# Patient Record
Sex: Female | Born: 1945 | Race: White | Hispanic: No | Marital: Married | Smoking: Never smoker
Health system: Southern US, Community
[De-identification: ages and names within clinical notes are randomized; demographics above are authoritative.]

## PROBLEM LIST (undated history)

## (undated) DIAGNOSIS — K5790 Diverticulosis of intestine, part unspecified, without perforation or abscess without bleeding: Secondary | ICD-10-CM

## (undated) DIAGNOSIS — E785 Hyperlipidemia, unspecified: Secondary | ICD-10-CM

## (undated) DIAGNOSIS — K589 Irritable bowel syndrome without diarrhea: Secondary | ICD-10-CM

## (undated) DIAGNOSIS — I10 Essential (primary) hypertension: Secondary | ICD-10-CM

---

## 2021-04-22 ENCOUNTER — Inpatient Hospital Stay (HOSPITAL_BASED_OUTPATIENT_CLINIC_OR_DEPARTMENT_OTHER)
Admission: EM | Admit: 2021-04-22 | Discharge: 2021-04-26 | DRG: 356 | Disposition: A | Discharge status: Home / Self Care | Payer: Medicare Other | Attending: Internal Medicine | Admitting: Internal Medicine

## 2021-04-22 ENCOUNTER — Encounter (HOSPITAL_BASED_OUTPATIENT_CLINIC_OR_DEPARTMENT_OTHER): Payer: Self-pay | Admitting: Emergency Medicine

## 2021-04-22 ENCOUNTER — Other Ambulatory Visit: Payer: Self-pay

## 2021-04-22 DIAGNOSIS — K922 Gastrointestinal hemorrhage, unspecified: Secondary | ICD-10-CM | POA: Insufficient documentation

## 2021-04-22 DIAGNOSIS — R55 Syncope and collapse: Secondary | ICD-10-CM | POA: Diagnosis not present

## 2021-04-22 DIAGNOSIS — Z7982 Long term (current) use of aspirin: Secondary | ICD-10-CM

## 2021-04-22 DIAGNOSIS — Z8673 Personal history of transient ischemic attack (TIA), and cerebral infarction without residual deficits: Secondary | ICD-10-CM

## 2021-04-22 DIAGNOSIS — R52 Pain, unspecified: Secondary | ICD-10-CM

## 2021-04-22 DIAGNOSIS — I82451 Acute embolism and thrombosis of right peroneal vein: Secondary | ICD-10-CM | POA: Diagnosis present

## 2021-04-22 DIAGNOSIS — I959 Hypotension, unspecified: Secondary | ICD-10-CM | POA: Diagnosis not present

## 2021-04-22 DIAGNOSIS — R911 Solitary pulmonary nodule: Secondary | ICD-10-CM | POA: Diagnosis present

## 2021-04-22 DIAGNOSIS — D72829 Elevated white blood cell count, unspecified: Secondary | ICD-10-CM | POA: Diagnosis present

## 2021-04-22 DIAGNOSIS — K219 Gastro-esophageal reflux disease without esophagitis: Secondary | ICD-10-CM | POA: Diagnosis present

## 2021-04-22 DIAGNOSIS — K5731 Diverticulosis of large intestine without perforation or abscess with bleeding: Secondary | ICD-10-CM | POA: Diagnosis not present

## 2021-04-22 DIAGNOSIS — R000 Tachycardia, unspecified: Secondary | ICD-10-CM | POA: Diagnosis not present

## 2021-04-22 DIAGNOSIS — Z800 Family history of malignant neoplasm of digestive organs: Secondary | ICD-10-CM

## 2021-04-22 DIAGNOSIS — D62 Acute posthemorrhagic anemia: Secondary | ICD-10-CM | POA: Diagnosis present

## 2021-04-22 DIAGNOSIS — I2699 Other pulmonary embolism without acute cor pulmonale: Secondary | ICD-10-CM

## 2021-04-22 DIAGNOSIS — E785 Hyperlipidemia, unspecified: Secondary | ICD-10-CM | POA: Diagnosis present

## 2021-04-22 DIAGNOSIS — K625 Hemorrhage of anus and rectum: Secondary | ICD-10-CM | POA: Diagnosis not present

## 2021-04-22 DIAGNOSIS — Z20822 Contact with and (suspected) exposure to covid-19: Secondary | ICD-10-CM | POA: Diagnosis present

## 2021-04-22 DIAGNOSIS — Z79899 Other long term (current) drug therapy: Secondary | ICD-10-CM

## 2021-04-22 DIAGNOSIS — F39 Unspecified mood [affective] disorder: Secondary | ICD-10-CM | POA: Diagnosis present

## 2021-04-22 DIAGNOSIS — I10 Essential (primary) hypertension: Secondary | ICD-10-CM | POA: Diagnosis present

## 2021-04-22 DIAGNOSIS — K589 Irritable bowel syndrome without diarrhea: Secondary | ICD-10-CM | POA: Diagnosis present

## 2021-04-22 DIAGNOSIS — Z86010 Personal history of colonic polyps: Secondary | ICD-10-CM

## 2021-04-22 HISTORY — DX: Essential (primary) hypertension: I10

## 2021-04-22 HISTORY — DX: Irritable bowel syndrome without diarrhea: K58.9

## 2021-04-22 HISTORY — DX: Diverticulosis of intestine, part unspecified, without perforation or abscess without bleeding: K57.90

## 2021-04-22 HISTORY — DX: Hyperlipidemia, unspecified: E78.5

## 2021-04-22 LAB — CBC WITH DIFFERENTIAL/PLATELET
Abs Immature Granulocytes: 0.04 10*3/uL (ref 0.00–0.07)
Basophils Absolute: 0 10*3/uL (ref 0.0–0.1)
Basophils Relative: 0 %
Eosinophils Absolute: 0 10*3/uL (ref 0.0–0.5)
Eosinophils Relative: 0 %
HCT: 41.2 % (ref 36.0–46.0)
Hemoglobin: 13.3 g/dL (ref 12.0–15.0)
Immature Granulocytes: 0 %
Lymphocytes Relative: 13 %
Lymphs Abs: 1.6 10*3/uL (ref 0.7–4.0)
MCH: 29.5 pg (ref 26.0–34.0)
MCHC: 32.3 g/dL (ref 30.0–36.0)
MCV: 91.4 fL (ref 80.0–100.0)
Monocytes Absolute: 0.4 10*3/uL (ref 0.1–1.0)
Monocytes Relative: 4 %
Neutro Abs: 10 10*3/uL — ABNORMAL HIGH (ref 1.7–7.7)
Neutrophils Relative %: 83 %
Platelets: 241 10*3/uL (ref 150–400)
RBC: 4.51 MIL/uL (ref 3.87–5.11)
RDW: 13.2 % (ref 11.5–15.5)
WBC: 12.1 10*3/uL — ABNORMAL HIGH (ref 4.0–10.5)
nRBC: 0 % (ref 0.0–0.2)

## 2021-04-22 LAB — COMPREHENSIVE METABOLIC PANEL
ALT: 14 U/L (ref 0–44)
AST: 15 U/L (ref 15–41)
Albumin: 4.2 g/dL (ref 3.5–5.0)
Alkaline Phosphatase: 74 U/L (ref 38–126)
Anion gap: 8 (ref 5–15)
BUN: 19 mg/dL (ref 8–23)
CO2: 28 mmol/L (ref 22–32)
Calcium: 9.7 mg/dL (ref 8.9–10.3)
Chloride: 102 mmol/L (ref 98–111)
Creatinine, Ser: 0.5 mg/dL (ref 0.44–1.00)
GFR, Estimated: >60 mL/min (ref >60)
Glucose, Bld: 132 mg/dL — ABNORMAL HIGH (ref 70–99)
Potassium: 3.8 mmol/L (ref 3.5–5.1)
Sodium: 138 mmol/L (ref 135–145)
Total Bilirubin: 0.5 mg/dL (ref 0.3–1.2)
Total Protein: 6.7 g/dL (ref 6.5–8.1)

## 2021-04-22 LAB — LIPASE, BLOOD: Lipase: 18 U/L (ref 11–51)

## 2021-04-22 MED ORDER — SODIUM CHLORIDE 0.9 % IV SOLN: Freq: Once | INTRAVENOUS | Status: AC

## 2021-04-22 NOTE — ED Provider Notes (Signed)
[facility] Provider Note: Lowella Dell, MD, FACEP  CSN: [id number] MRN: [id number] ARRIVAL: 04/22/21 at 2009 ROOM: DB012/DB012   CHIEF COMPLAINT  Rectal Bleeding   HISTORY OF PRESENT ILLNESS  04/22/21 11:06 PM Stephanie Bowers is a 75 y.o. female who has had bright red blood per rectum since about 6 PM.  She has had about 6 grossly bloody bowel movements but also has persistent leakage of blood from her rectum.  She has no associated pain.  She denies lightheadedness, chest pain or shortness of breath.  She did vomit once but attributes this to nerves.  She is not on anticoagulation apart from 81 mg of aspirin daily.   Past Medical History:  Diagnosis Date   Hyperlipidemia    Hypertension    IBS (irritable bowel syndrome)     History reviewed. No pertinent surgical history.  History reviewed. No pertinent family history.  Social History   Tobacco Use   Smoking status: Never  Substance Use Topics   Alcohol use: Yes    Prior to Admission medications   Not on File    Allergies Patient has no allergy information on record.   REVIEW OF SYSTEMS  Negative except as noted here or in the History of Present Illness.   PHYSICAL EXAMINATION  Initial Vital Signs Blood pressure 132/75, pulse 75, temperature 98.6 F (37 C), resp. rate 18, height 5\' 6"  (1.676 m), weight 76.2 kg, SpO2 95 %.  Examination General: Well-developed, well-nourished female in no acute distress; appearance consistent with age of record HENT: normocephalic; atraumatic Eyes: pupils equal, round and reactive to light; extraocular muscles intact Neck: supple Heart: regular rate and rhythm Lungs: clear to auscultation bilaterally Abdomen: soft; nondistended; nontender; bowel sounds present Rectal: Normal sphincter tone; profuse bright red blood and clots in perianal area; blood and clots in rectal vault Extremities: No deformity; full range of motion; pulses normal Neurologic: Awake,  alert and oriented; motor function intact in all extremities and symmetric; no facial droop Skin: Warm and dry Psychiatric: Normal mood and affect   RESULTS  Summary of this visit's results, reviewed and interpreted by myself:   EKG Interpretation  Date/Time:    Ventricular Rate:    PR Interval:    QRS Duration:   QT Interval:    QTC Calculation:   R Axis:     Text Interpretation:         Laboratory Studies: Results for orders placed or performed during the hospital encounter of 04/22/21 (from the past 24 hour(s))  CBC with Differential     Status: Abnormal   Collection Time: 04/22/21  9:17 PM  Result Value Ref Range   WBC 12.1 (H) 4.0 - 10.5 K/uL   RBC 4.51 3.87 - 5.11 MIL/uL   Hemoglobin 13.3 12.0 - 15.0 g/dL   HCT 04/24/21 51.7 - 61.6 %   MCV 91.4 80.0 - 100.0 fL   MCH 29.5 26.0 - 34.0 pg   MCHC 32.3 30.0 - 36.0 g/dL   RDW 07.3 71.0 - 62.6 %   Platelets 241 150 - 400 K/uL   nRBC 0.0 0.0 - 0.2 %   Neutrophils Relative % 83 %   Neutro Abs 10.0 (H) 1.7 - 7.7 K/uL   Lymphocytes Relative 13 %   Lymphs Abs 1.6 0.7 - 4.0 K/uL   Monocytes Relative 4 %   Monocytes Absolute 0.4 0.1 - 1.0 K/uL   Eosinophils Relative 0 %   Eosinophils Absolute 0.0 0.0 - 0.5 K/uL  Basophils Relative 0 %   Basophils Absolute 0.0 0.0 - 0.1 K/uL   Immature Granulocytes 0 %   Abs Immature Granulocytes 0.04 0.00 - 0.07 K/uL  Comprehensive metabolic panel     Status: Abnormal   Collection Time: 04/22/21  9:17 PM  Result Value Ref Range   Sodium 138 135 - 145 mmol/L   Potassium 3.8 3.5 - 5.1 mmol/L   Chloride 102 98 - 111 mmol/L   CO2 28 22 - 32 mmol/L   Glucose, Bld 132 (H) 70 - 99 mg/dL   BUN 19 8 - 23 mg/dL   Creatinine, Ser 0.96 0.44 - 1.00 mg/dL   Calcium 9.7 8.9 - 04.5 mg/dL   Total Protein 6.7 6.5 - 8.1 g/dL   Albumin 4.2 3.5 - 5.0 g/dL   AST 15 15 - 41 U/L   ALT 14 0 - 44 U/L   Alkaline Phosphatase 74 38 - 126 U/L   Total Bilirubin 0.5 0.3 - 1.2 mg/dL   GFR, Estimated >40 >98  mL/min   Anion gap 8 5 - 15  Lipase, blood     Status: None   Collection Time: 04/22/21  9:17 PM  Result Value Ref Range   Lipase 18 11 - 51 U/L   Imaging Studies: No results found.  ED COURSE and MDM  Nursing notes, initial and subsequent vitals signs, including pulse oximetry, reviewed and interpreted by myself.  Vitals:   04/22/21 2022 04/22/21 2200  BP: (!) 144/92 132/75  Pulse: 96 75  Resp: 18 18  Temp: 98.6 F (37 C)   SpO2: 97% 95%  Weight: 76.2 kg   Height: 5\' 6"  (1.676 m)    Medications  0.9 %  sodium chloride infusion (has no administration in time range)   Patient has gross rectal bleeding with passage of clots.  We will have the patient admitted for further treatment and work-up.  Her gastroenterologist is in [place] and has no local physician.  Dr. [place] to admit patient to hospitalist service.  Dr. Arville Care of gastroenterology consulted.  PROCEDURES  Procedures   ED DIAGNOSES     ICD-10-CM   1. Acute lower GI bleeding  K92.2          Loretta Doutt, MD 04/23/21 0157

## 2021-04-22 NOTE — ED Triage Notes (Signed)
Pt arrives pov with c/o acute onset rectal bleeding tonight, endorses 6 episodes. Pt denies constipation or diarrhea. Pt also reports 1 episode of emesis. Denies thinners, endorses 81 mg aspirin daily

## 2021-04-23 ENCOUNTER — Inpatient Hospital Stay (HOSPITAL_COMMUNITY): Payer: Medicare Other

## 2021-04-23 ENCOUNTER — Encounter (HOSPITAL_BASED_OUTPATIENT_CLINIC_OR_DEPARTMENT_OTHER): Payer: Self-pay | Admitting: Family Medicine

## 2021-04-23 DIAGNOSIS — I2699 Other pulmonary embolism without acute cor pulmonale: Secondary | ICD-10-CM | POA: Diagnosis present

## 2021-04-23 DIAGNOSIS — E785 Hyperlipidemia, unspecified: Secondary | ICD-10-CM

## 2021-04-23 DIAGNOSIS — K5731 Diverticulosis of large intestine without perforation or abscess with bleeding: Secondary | ICD-10-CM | POA: Diagnosis present

## 2021-04-23 DIAGNOSIS — K922 Gastrointestinal hemorrhage, unspecified: Secondary | ICD-10-CM

## 2021-04-23 DIAGNOSIS — Z86010 Personal history of colonic polyps: Secondary | ICD-10-CM | POA: Diagnosis not present

## 2021-04-23 DIAGNOSIS — R000 Tachycardia, unspecified: Secondary | ICD-10-CM | POA: Diagnosis not present

## 2021-04-23 DIAGNOSIS — I959 Hypotension, unspecified: Secondary | ICD-10-CM | POA: Diagnosis not present

## 2021-04-23 DIAGNOSIS — I10 Essential (primary) hypertension: Secondary | ICD-10-CM | POA: Diagnosis present

## 2021-04-23 DIAGNOSIS — R911 Solitary pulmonary nodule: Secondary | ICD-10-CM | POA: Diagnosis present

## 2021-04-23 DIAGNOSIS — Z800 Family history of malignant neoplasm of digestive organs: Secondary | ICD-10-CM | POA: Diagnosis not present

## 2021-04-23 DIAGNOSIS — D62 Acute posthemorrhagic anemia: Secondary | ICD-10-CM | POA: Diagnosis present

## 2021-04-23 DIAGNOSIS — Z7982 Long term (current) use of aspirin: Secondary | ICD-10-CM | POA: Diagnosis not present

## 2021-04-23 DIAGNOSIS — K589 Irritable bowel syndrome without diarrhea: Secondary | ICD-10-CM | POA: Diagnosis present

## 2021-04-23 DIAGNOSIS — R55 Syncope and collapse: Secondary | ICD-10-CM | POA: Diagnosis not present

## 2021-04-23 DIAGNOSIS — D72829 Elevated white blood cell count, unspecified: Secondary | ICD-10-CM

## 2021-04-23 DIAGNOSIS — K219 Gastro-esophageal reflux disease without esophagitis: Secondary | ICD-10-CM

## 2021-04-23 DIAGNOSIS — I82451 Acute embolism and thrombosis of right peroneal vein: Secondary | ICD-10-CM | POA: Diagnosis present

## 2021-04-23 DIAGNOSIS — F39 Unspecified mood [affective] disorder: Secondary | ICD-10-CM | POA: Diagnosis present

## 2021-04-23 DIAGNOSIS — F419 Anxiety disorder, unspecified: Secondary | ICD-10-CM | POA: Diagnosis not present

## 2021-04-23 DIAGNOSIS — Z20822 Contact with and (suspected) exposure to covid-19: Secondary | ICD-10-CM | POA: Diagnosis present

## 2021-04-23 DIAGNOSIS — K625 Hemorrhage of anus and rectum: Secondary | ICD-10-CM | POA: Diagnosis present

## 2021-04-23 DIAGNOSIS — Z79899 Other long term (current) drug therapy: Secondary | ICD-10-CM | POA: Diagnosis not present

## 2021-04-23 DIAGNOSIS — Z8673 Personal history of transient ischemic attack (TIA), and cerebral infarction without residual deficits: Secondary | ICD-10-CM | POA: Diagnosis not present

## 2021-04-23 HISTORY — PX: IR ANGIOGRAM SELECTIVE EACH ADDITIONAL VESSEL: IMG667

## 2021-04-23 HISTORY — PX: IR EMBO ARTERIAL NOT HEMORR HEMANG INC GUIDE ROADMAPPING: IMG5448

## 2021-04-23 HISTORY — PX: IR US GUIDE VASC ACCESS RIGHT: IMG2390

## 2021-04-23 HISTORY — PX: IR ANGIOGRAM VISCERAL SELECTIVE: IMG657

## 2021-04-23 LAB — MAGNESIUM: Magnesium: 2.1 mg/dL (ref 1.7–2.4)

## 2021-04-23 LAB — CBC
HCT: 36.9 % (ref 36.0–46.0)
HCT: 35.8 % — ABNORMAL LOW (ref 36.0–46.0)
Hemoglobin: 12.2 g/dL (ref 12.0–15.0)
Hemoglobin: 11.7 g/dL — ABNORMAL LOW (ref 12.0–15.0)
MCH: 30.2 pg (ref 26.0–34.0)
MCH: 30.2 pg (ref 26.0–34.0)
MCHC: 33.1 g/dL (ref 30.0–36.0)
MCHC: 32.7 g/dL (ref 30.0–36.0)
MCV: 91.3 fL (ref 80.0–100.0)
MCV: 92.5 fL (ref 80.0–100.0)
Platelets: 216 10*3/uL (ref 150–400)
Platelets: 219 10*3/uL (ref 150–400)
RBC: 4.04 MIL/uL (ref 3.87–5.11)
RBC: 3.87 MIL/uL (ref 3.87–5.11)
RDW: 13.1 % (ref 11.5–15.5)
RDW: 13.2 % (ref 11.5–15.5)
WBC: 10.1 10*3/uL (ref 4.0–10.5)
WBC: 11.3 10*3/uL — ABNORMAL HIGH (ref 4.0–10.5)
nRBC: 0 % (ref 0.0–0.2)
nRBC: 0 % (ref 0.0–0.2)

## 2021-04-23 LAB — COMPREHENSIVE METABOLIC PANEL
ALT: 17 U/L (ref 0–44)
AST: 17 U/L (ref 15–41)
Albumin: 3.2 g/dL — ABNORMAL LOW (ref 3.5–5.0)
Alkaline Phosphatase: 68 U/L (ref 38–126)
Anion gap: 10 (ref 5–15)
BUN: 16 mg/dL (ref 8–23)
CO2: 25 mmol/L (ref 22–32)
Calcium: 8.2 mg/dL — ABNORMAL LOW (ref 8.9–10.3)
Chloride: 102 mmol/L (ref 98–111)
Creatinine, Ser: 0.52 mg/dL (ref 0.44–1.00)
GFR, Estimated: >60 mL/min (ref >60)
Glucose, Bld: 127 mg/dL — ABNORMAL HIGH (ref 70–99)
Potassium: 3.4 mmol/L — ABNORMAL LOW (ref 3.5–5.1)
Sodium: 137 mmol/L (ref 135–145)
Total Bilirubin: 0.8 mg/dL (ref 0.3–1.2)
Total Protein: 5.7 g/dL — ABNORMAL LOW (ref 6.5–8.1)

## 2021-04-23 LAB — TYPE AND SCREEN
ABO/RH(D): A POS
Antibody Screen: NEGATIVE

## 2021-04-23 LAB — RESP PANEL BY RT-PCR (FLU A&B, COVID) ARPGX2
Influenza A by PCR: NEGATIVE
Influenza B by PCR: NEGATIVE
SARS Coronavirus 2 by RT PCR: NEGATIVE

## 2021-04-23 LAB — HEMOGLOBIN AND HEMATOCRIT, BLOOD
HCT: 36.7 % (ref 36.0–46.0)
Hemoglobin: 12.1 g/dL (ref 12.0–15.0)

## 2021-04-23 LAB — PROTIME-INR
INR: 1 (ref 0.8–1.2)
Prothrombin Time: 13.6 seconds (ref 11.4–15.2)

## 2021-04-23 LAB — ABO/RH: ABO/RH(D): A POS

## 2021-04-23 MED ORDER — METOCLOPRAMIDE HCL 5 MG/ML IJ SOLN
10.0000 mg | Freq: Once | INTRAMUSCULAR | Status: AC
Start: 2021-04-23 — End: 2021-04-23
  Administered 2021-04-23: 10 mg via INTRAVENOUS
  Filled 2021-04-23: qty 2

## 2021-04-23 MED ORDER — MIDAZOLAM HCL 2 MG/2ML IJ SOLN
INTRAMUSCULAR | Status: AC
  Filled 2021-04-23: qty 2

## 2021-04-23 MED ORDER — LIP MEDEX EX OINT
TOPICAL_OINTMENT | CUTANEOUS | Status: DC | PRN
  Filled 2021-04-23: qty 7

## 2021-04-23 MED ORDER — BISACODYL 5 MG PO TBEC
15.0000 mg | DELAYED_RELEASE_TABLET | Freq: Once | ORAL | Status: AC
  Administered 2021-04-23: 15 mg via ORAL
  Filled 2021-04-23: qty 3

## 2021-04-23 MED ORDER — MIDAZOLAM HCL 2 MG/2ML IJ SOLN
INTRAMUSCULAR | Status: AC | PRN
  Administered 2021-04-23 (×2): 1 mg via INTRAVENOUS

## 2021-04-23 MED ORDER — METOCLOPRAMIDE HCL 5 MG/ML IJ SOLN
10.0000 mg | Freq: Once | INTRAMUSCULAR | Status: DC
  Filled 2021-04-23: qty 2

## 2021-04-23 MED ORDER — FENTANYL CITRATE (PF) 100 MCG/2ML IJ SOLN
INTRAMUSCULAR | Status: AC
  Filled 2021-04-23: qty 2

## 2021-04-23 MED ORDER — LACTATED RINGERS IV SOLN: INTRAVENOUS | Status: DC

## 2021-04-23 MED ORDER — PANTOPRAZOLE SODIUM 40 MG IV SOLR
40.0000 mg | INTRAVENOUS | Status: DC
  Administered 2021-04-23 – 2021-04-25 (×3): 40 mg via INTRAVENOUS
  Filled 2021-04-23 (×3): qty 40

## 2021-04-23 MED ORDER — PEG 3350-KCL-NABCB-NACL-NASULF 236 G PO SOLR
4000.0000 mL | Freq: Once | ORAL | Status: DC
  Filled 2021-04-23: qty 4000

## 2021-04-23 MED ORDER — ACETAMINOPHEN 650 MG RE SUPP: 650.0000 mg | Freq: Four times a day (QID) | RECTAL | Status: DC | PRN

## 2021-04-23 MED ORDER — GELATIN ABSORBABLE 12-7 MM EX MISC
CUTANEOUS | Status: AC
  Filled 2021-04-23: qty 1

## 2021-04-23 MED ORDER — IOHEXOL 350 MG/ML SOLN
100.0000 mL | Freq: Once | INTRAVENOUS | Status: AC | PRN
  Administered 2021-04-23: 100 mL via INTRAVENOUS

## 2021-04-23 MED ORDER — POTASSIUM CHLORIDE 2 MEQ/ML IV SOLN
INTRAVENOUS | Status: DC
  Filled 2021-04-23 (×3): qty 1000

## 2021-04-23 MED ORDER — FENTANYL CITRATE (PF) 100 MCG/2ML IJ SOLN
INTRAMUSCULAR | Status: AC | PRN
  Administered 2021-04-23 (×2): 25 ug via INTRAVENOUS
  Administered 2021-04-23: 50 ug via INTRAVENOUS

## 2021-04-23 MED ORDER — IOHEXOL 300 MG/ML  SOLN
100.0000 mL | Freq: Once | INTRAMUSCULAR | Status: AC | PRN
  Administered 2021-04-23: 62 mL via INTRA_ARTERIAL

## 2021-04-23 MED ORDER — LIDOCAINE HCL 1 % IJ SOLN
INTRAMUSCULAR | Status: AC
  Administered 2021-04-23: 10 mL
  Filled 2021-04-23: qty 20

## 2021-04-23 MED ORDER — PEG-KCL-NACL-NASULF-NA ASC-C 100 G PO SOLR: 1.0000 | Freq: Once | ORAL | Status: DC

## 2021-04-23 MED ORDER — PEG-KCL-NACL-NASULF-NA ASC-C 100 G PO SOLR
0.5000 | Freq: Once | ORAL | Status: DC
  Filled 2021-04-23: qty 1

## 2021-04-23 MED ORDER — ONDANSETRON HCL 4 MG/2ML IJ SOLN
4.0000 mg | Freq: Four times a day (QID) | INTRAMUSCULAR | Status: DC | PRN
  Administered 2021-04-23: 4 mg via INTRAVENOUS
  Filled 2021-04-23: qty 2

## 2021-04-23 MED ORDER — PEG 3350-KCL-NABCB-NACL-NASULF 236 G PO SOLR: 4000.0000 mL | Freq: Once | ORAL | Status: DC

## 2021-04-23 MED ORDER — ACETAMINOPHEN 325 MG PO TABS
650.0000 mg | ORAL_TABLET | Freq: Four times a day (QID) | ORAL | Status: DC | PRN
  Administered 2021-04-24 – 2021-04-26 (×5): 650 mg via ORAL
  Filled 2021-04-23 (×6): qty 2

## 2021-04-23 MED ORDER — PEG-KCL-NACL-NASULF-NA ASC-C 100 G PO SOLR: 0.5000 | Freq: Once | ORAL | Status: DC

## 2021-04-23 NOTE — Sedation Documentation (Signed)
Report called to [place], Charity fundraiser. Pt tolerated procedure very well. Vitals stable.  Totals: Time: Fentanyl Versed 2mg 

## 2021-04-23 NOTE — Progress Notes (Signed)
  PROGRESS NOTE  Patient admitted earlier this morning. See H&P.   Patient presented with multiple episodes of bright red blood per rectum.  She is currently on baby aspirin daily, has recently been taking ibuprofen due to her shoulder pain about a week and a half ago.  Denies any melena, nausea, vomiting or abdominal pain.  GI consulted Hold aspirin Continue to monitor hemoglobin Remains on clear liquid diet Replace potassium  Status is: Inpatient  Remains inpatient appropriate because: GI bleed work-up   Noralee Stain, DO [facility] 04/23/2021, 12:11 PM  Available via Epic secure chat 7am-7pm After these hours, please refer to coverage provider listed on [email or web address]

## 2021-04-23 NOTE — Sedation Documentation (Signed)
Patient is resting comfortably. 

## 2021-04-23 NOTE — Sedation Documentation (Signed)
Vital signs stable. 

## 2021-04-23 NOTE — Sedation Documentation (Signed)
Patient is resting comfortably. Asleep 

## 2021-04-23 NOTE — H&P (Signed)
History and Physical    PLEASE NOTE THAT DRAGON DICTATION SOFTWARE WAS USED IN THE CONSTRUCTION OF THIS NOTE.   Stephanie Bowers VBT:[id number] DOB: [birth year] DOA: 04/22/2021  PCP: Norton Pastel, MD  Patient coming from: home   I have personally briefly reviewed patient's old medical records in [place]  Chief Complaint:  bright red Blood per rectum  HPI: Stephanie Bowers is a 75 y.o. female with medical history significant for diverticulosis, hypertension, hyperlipidemia, GERD, who is admitted to [facility] on 04/22/2021 by way of transfer from [facility] emergency department with suspected acute lower gastrointestinal bleed after presenting from home to the latter facility complaining of bright red blood per rectum.   Starting between 1718 100 on 04/23/2019, reports development of 5-6 episodes of bright red blood per rectum with most recent episode occurring at [facility] emergency department.  Denies any associated melena, abdominal pain, or recent preceding trauma.  She reported transient nausea resulting in 1 episode of nonbloody, nonbilious emesis, without any subsequent nausea noted.  No recent diarrhea.  Denies any personal history of malignancy or unintentional recent weight loss.denies any associated chest pain, shortness of breath, palpitations, dizziness, or diaphoresis. Denies any associated subjective fever, chills, or rigors.  Patient does not recall at this time the timing of her most recent prior colonoscopy.  She notes daily consumption of 1 glass of wine but no recent increase in volume of alcohol consumed relative to this baseline.  On a daily baby aspirin, but otherwise no additional blood thinners at home.  She acknowledges a history of GERD for which she is on Dexilant.  She conveys that she typically abstains from NSAIDs, but notes approximately 1 week ago, that she started taking 400 ibuprofen as well as 1 dose of naproxen per day x3 days for  musculoskeletal discomfort involving the left shoulder.  Last dose of NSAIDs occurring 3 to 4 days ago.  Denies any use of BC's or Goodies powder.  She denies any known prior history of gastrointestinal bleed.  And denies any known history of chronic liver disease.    ED Course:  Vital signs in the ED were notable for the following: Afebrile; heart rate 75 cubic 84; blood pressure 120/60 -144/92; respiratory rate 18, oxygen saturation 95 to 97% on room air.  Labs were notable for the following: CMP is notable for the following BUN 19 compared to most recent prior BUN value of 16 on 09/03/2020, creatinine 0.50, and liver enzymes were found to be within normal limits.  CBC notable for white blood cell count 12,100, hemoglobin 13.3 relative to most recent prior value of 13.9 on 09/03/2020, with presenting hemoglobin associated with normocytic/normochromic findings as well as nonelevated RDW, platelet count 241.  Screening COVID-19/influenza PCR were found to be negative.  EDP at [place] discussed patient's case with on-call gastroenterologist, Dr. Paulita Fujita, who recommended admission to the hospitalist service at [facility] for further evaluation and management of suspected acute lower gastrointestinal bleed, with Dr. Paulita Fujita conveying that he will formally consult and plans to see the patient this morning.   While in the ED, the following were administered: Continuous normal saline.     Review of Systems: As per HPI otherwise 10 point review of systems negative.   Past Medical History:  Diagnosis Date   Diverticulosis    Hyperlipidemia    Hypertension    IBS (irritable bowel syndrome)     History reviewed. No pertinent surgical history.  Social History:  reports  that she has never smoked. She has never used smokeless tobacco. She reports current alcohol use. She reports that she does not use drugs.  History reviewed. No pertinent family history.   Outpatient medications: Outpatient  medications include as needed Xanax, daily baby aspirin, Dexilant, Lexapro, HCTZ, losartan, and rosuvastatin 20 mg p.o. daily.    Objective    Physical Exam: Vitals:   04/22/21 2300 04/23/21 0200 04/23/21 0300 04/23/21 0405  BP: 119/74 123/88 122/80 120/66  Pulse: 85 82 80 84  Resp: $Remo'18 18  18  'dBNLO$ Temp:    98.4 F (36.9 C)  TempSrc:    Oral  SpO2: 95% 95% 97% 97%  Weight:      Height:        General: appears to be stated age; alert, oriented Skin: warm, dry, no rash Head:  AT/[place] Mouth:  Oral mucosa membranes appear dry, normal dentition Neck: supple; trachea midline Heart:  RRR; did not appreciate any M/R/G Lungs: CTAB, did not appreciate any wheezes, rales, or rhonchi Abdomen: + BS; soft, ND, NT Vascular: 2+ pedal pulses b/l; 2+ radial pulses b/l Extremities: no peripheral edema, no muscle wasting Neuro: strength and sensation intact in upper and lower extremities b/l    Labs on Admission: I have personally reviewed following labs and imaging studies  CBC: Recent Labs  Lab 04/22/21 2117  WBC 12.1*  NEUTROABS 10.0*  HGB 13.3  HCT 41.2  MCV 91.4  PLT 510   Basic Metabolic Panel: Recent Labs  Lab 04/22/21 2117  NA 138  K 3.8  CL 102  CO2 28  GLUCOSE 132*  BUN 19  CREATININE 0.50  CALCIUM 9.7   GFR: Estimated Creatinine Clearance: 62.4 mL/min (by C-G formula based on SCr of 0.5 mg/dL). Liver Function Tests: Recent Labs  Lab 04/22/21 2117  AST 15  ALT 14  ALKPHOS 74  BILITOT 0.5  PROT 6.7  ALBUMIN 4.2   Recent Labs  Lab 04/22/21 2117  LIPASE 18   No results for input(s): AMMONIA in the last 168 hours. Coagulation Profile: No results for input(s): INR, PROTIME in the last 168 hours. Cardiac Enzymes: No results for input(s): CKTOTAL, CKMB, CKMBINDEX, TROPONINI in the last 168 hours. BNP (last 3 results) No results for input(s): PROBNP in the last 8760 hours. HbA1C: No results for input(s): HGBA1C in the last 72 hours. CBG: No results for  input(s): GLUCAP in the last 168 hours. Lipid Profile: No results for input(s): CHOL, HDL, LDLCALC, TRIG, CHOLHDL, LDLDIRECT in the last 72 hours. Thyroid Function Tests: No results for input(s): TSH, T4TOTAL, FREET4, T3FREE, THYROIDAB in the last 72 hours. Anemia Panel: No results for input(s): VITAMINB12, FOLATE, FERRITIN, TIBC, IRON, RETICCTPCT in the last 72 hours. Urine analysis: No results found for: COLORURINE, APPEARANCEUR, LABSPEC, PHURINE, GLUCOSEU, HGBUR, BILIRUBINUR, KETONESUR, PROTEINUR, UROBILINOGEN, NITRITE, LEUKOCYTESUR  Radiological Exams on Admission: No results found.    Assessment/Plan   Principal Problem:   Acute lower GI bleeding Active Problems:   Leukocytosis   Hyperlipidemia   Hypertension   GERD (gastroesophageal reflux disease)     #) Acute lower GI bleed: diagnosis on the basis of 6 episodes of bright red blood per rectum starting between 1718 and 25 2022. A non-elevated presenting BUN further substantiates suspicion of a lower GI source.  Patient's relative the asymptomatic nature as well as her.  Hemodynamic stability also to speak towards a lower gastrointestinal source of her bleed.  Presenting hemoglobin consistent with most recent prior, as further quantified  above.  On daily baby aspirin, but otherwise no additional blood thinners as an outpatient.No reported history of alcohol abuse. No known history of liver disease to warrant SBP prophylaxis.  Recently engaged in 3-day course of naproxen/ibuprofen use, terminating this usage 3 to 4 days ago.  Otherwise, no use of NSAIDs.  Differential appears to favor diverticular bleed in the context of a documented history of diverticulosis.  Differential also includes bleeding colonic polyps, AVM ,hemorrhoids.  Given suspected lower GI source, there does not appear to be an indication for initiation of Protonix drip.  I will order type and screen.  At this time, no indication for empiric PRBC transfusion, but will  closely monitor for ensuing development of septic emesis.  Dr. Paulita Fujita gastroenterology has been consulted, and will formally see the patient in the hospital this morning.      Plan: NPO. Refraining from pharmacologic DVT prophylaxis. Monitor on telemetry. Monitor continuous pulse-ox. Maintain at least 2 large bore IV's. Check INR in the AM.  StatCBC repeat BMP.. Q4H H&H's have been ordered through 9 AM on 04/23/2021. Will closely monitor these ensuing Hgb levels and correlate these data points with the patient's overall clinical picture including vital signs to determine need for subsequent transfusion.  Type and screen ordered by me.  Hold home aspirin.  Gastroenterology consulted, as above.  Gentle continuous LR at 50 cc/h.      #) Leukocytosis: CBC reflects blood cell count of 12,100.  Suspect that this is inflammatory in nature in the setting of presenting acute lower gastrointestinal bleed.  No clinical evidence of underlying infectious process at this time, and criteria for sepsis are not met.  Plan: Further evaluation of suspected presenting acute lower gastrointestinal bleed, as above.  Gentle IV fluids, as above.  Repeat CBC with differential in the morning.  Check urinalysis.      #) Essential Hypertension: documented history of such, with outpatient antihypertensive regimen including losartan, HCTZ.  Systolic blood pressures pressure in the ED today in the 120s to 140s, without any evidence of hypotension.    Plan: Close monitoring of subsequent blood pressure via routine vital signs.  In the setting of current n.p.o. status prompted by acute lower gastrointestinal bleed, will hold home antihypertensive medications for now.       #) Hyperlipidemia: documented h/o such. On high intensity rosuvastatin as outpatient.    Plan: Hold home statin for now in the setting of current n.p.o. status.      #) GERD: Documented history of such, on Dexilant as an outpatient followed  by gastroenterology associated with [facility] in the [place] area.  Plan: In the setting of Current n.p.o. status, will hold home [place] for now.  Rather I have ordered Protonix 40 mg IV daily while remaining n.p.o.     DVT prophylaxis: SCD's   Code Status: Full code Family Communication: I discussed the patient's case with her husband, who is present at bedside Disposition Plan: Per Rounding Team Consults called: EDP at [facility] consulted Dr. Paulita Fujita of GI, as [place] detailed above;  Admission status: Accepting physician placed order for inpatient admission; med telemetry   PLEASE NOTE THAT DRAGON DICTATION SOFTWARE WAS USED IN THE CONSTRUCTION OF THIS NOTE.   [place] DO [facility]  From [place]   04/23/2021, 4:24 AM

## 2021-04-23 NOTE — Sedation Documentation (Signed)
Patient is resting comfortably. Vitals stable. Procedure continues

## 2021-04-23 NOTE — Sedation Documentation (Signed)
Report given to [facility] via phone and at bedside. Right groin unremarkable at handoff. Pt vitals stable. Pt has no complaints at this time. Pt husband at bedside.

## 2021-04-23 NOTE — Progress Notes (Addendum)
     CTA shows active bleeding in the distal sigmoid. I discussed the results with the patient and her husband by phone. They would like to proceed with IR consultation for angiogram +/- embolization.   Tressia Danas, MD, MPH [facility]

## 2021-04-23 NOTE — Procedures (Signed)
Pre-procedure Diagnosis: Acute lower GI bleeding Post-procedure Diagnosis: Same  Post mesenteric arteriogram and percutaneous coil embolization of distal arcade of sigmoidal artery supplying ill defined area of diverticular bleeding.    Complications: None Immediate EBL: None  Keep right leg straight for 4 hrs (until 02:30)  Signed: Simonne Come Pager: [phone] 04/23/2021, 10:40 PM

## 2021-04-23 NOTE — Consult Note (Signed)
Referring Provider: [facility] PCP: Norton Pastel, MD  Gastroenterologist: [place] Reason for consultation:    GI bleed               ASSESSMENT / PLAN   # 75 yo female from [place] admitted with painless hematochezia. She gives a history of diverticulosis. Suspect this is a diverticular hemorrhage. Low suspicion for brisk upper bleed despite recent NSAID use and light brown emesis yesterday (previously had some bourbon). She is hemodynamically stable     Hgb today is 12.1, down from 13.3, yesterday.   -I have discussed plan with patient and her RN. If persist bleeding then RN will contact us about getting CTA abd/pelvis.   Cr 0.52.         --Repeat CBC at noon --Clear liquids okay --Agree with BID PPI for now for precautionary measures   # GERD, symptoms controlled on Dexilant. No history of Barrett's esophagus per patient  # Reported personal history of colon polyps / [facility] of colon cancer in father. Followed by GI in [place]. Gets 5 year surveillance colonoscopies, next one due within a couple of months.   # Additional medical history listed below.   HISTORY OF PRESENT ILLNESS                                                                                                                         Chief Complaint: rectal bleeding  Stephanie Bowers is a 75 y.o. female with a past medical history significant for GAD, diverticulosis, IBS, HTN, HLD, GERD,  See [facility] for any additional medical problems.    ED course:  Patient presented to ED yesterday for evaluation of rectal bleeding and one episode of emesis.   In ED, VSS. WBC12.1, hgb 13.3, MCV 91, platelets 241. BUN 19, lipase normal. Liver tests normal.   Patient is followed by GI in [place] for history of GERD, IBS, history of colon polyps and a [facility] of colon cancer in her father ( diagnosed age 89). She is due for 5 year follow up colonoscopy within the next couples of months.  No previous history of GI bleeding.  Yesterday patient began having painless rectal bleeding. She was passing " gushes" of red blood without stool. A couple of hours later she vomited once ( dark emesis but had also had some bourbon). She thinks the vomiting was due to "my nerves". She had additional episodes of hematochezia after arriving to ED. She had transient lightheadedness around the time of vomiting. Arina gives a history of IBS with alternating bowel habits. She has a remote history of hemorrhoid surgery.   A couple of weeks ago patient began taking Ibuprofen for shoulder pain. She takes Dexilant everyday for GERD. .   IMAGING:  No results found.   PREVIOUS ENDOSCOPIC EVALUATIONS  / IMAGING STUDIES   EGDs and colonoscopy with her GI in [place]   Past Medical History:  Diagnosis Date   Diverticulosis    Hyperlipidemia  Hypertension    IBS (irritable bowel syndrome)     History reviewed. No pertinent surgical history.  Prior to Admission medications   Medication Sig Start Date End Date Taking? Authorizing Provider  ALPRAZolam Duanne Moron) 0.25 MG tablet Take 0.25 mg by mouth at bedtime as needed for sleep. 02/09/21  Yes [provider]  aspirin 81 MG EC tablet Take 81 mg by mouth daily.   Yes [provider]  dexlansoprazole (DEXILANT) 60 MG capsule Take 1 capsule by mouth daily. 03/08/21  Yes [provider]  escitalopram (LEXAPRO) 10 MG tablet Take 10 mg by mouth daily. 03/12/21  Yes [provider]  hydrochlorothiazide (MICROZIDE) 12.5 MG capsule Take 12.5 mg by mouth daily. 02/25/21  Yes [provider]  losartan (COZAAR) 100 MG tablet Take 100 mg by mouth daily. 02/27/21  Yes [provider]  Multiple Vitamin (MULTIVITAMIN) tablet Take 1 tablet by mouth every other day.   Yes [provider]  rosuvastatin (CRESTOR) 20 MG tablet Take 20 mg by mouth at bedtime. 05/01/20  Yes [provider]    Current Facility-Administered Medications   Medication Dose Route Frequency Provider Last Rate Last Admin   acetaminophen (TYLENOL) tablet 650 mg  650 mg Oral Q6H PRN Howerter, Justin B, DO       Or   acetaminophen (TYLENOL) suppository 650 mg  650 mg Rectal Q6H PRN Howerter, Justin B, DO       lactated ringers 1,000 mL with potassium chloride 40 mEq infusion   Intravenous Continuous Dessa Phi, DO 50 mL/hr at 04/23/21 0852 New Bag at 04/23/21 0852   pantoprazole (PROTONIX) injection 40 mg  40 mg Intravenous Q24H Howerter, Justin B, DO   40 mg at 04/23/21 0505    Allergies as of 04/22/2021   (Not on File)   [facility] : colon cancer in father ( diagnosed age 4)   Social History   Socioeconomic History   Marital status: Married    Spouse name: Not on file   Number of children: Not on file   Years of education: Not on file   Highest education level: Not on file  Occupational History   Not on file  Tobacco Use   Smoking status: Never   Smokeless tobacco: Never  Vaping Use   Vaping Use: Never used  Substance and Sexual Activity   Alcohol use: Yes    Comment: 1 glass of wine per day   Drug use: Never   Sexual activity: Not on file  Other Topics Concern   Not on file  Social History Narrative   Not on file   Social Determinants of Health   Financial Resource Strain: Not on file  Food Insecurity: Not on file  Transportation Needs: Not on file  Physical Activity: Not on file  Stress: Not on file  Social Connections: Not on file  Intimate Partner Violence: Not on file    Review of Systems: All systems reviewed and negative except where noted in HPI.   OBJECTIVE    Physical Exam: Vital signs in last 24 hours: Temp:  [98.4 F (36.9 C)-98.9 F (37.2 C)] 98.9 F (37.2 C) (11/26 0902) Pulse Rate:  [75-96] 78 (11/26 0902) Resp:  [18] 18 (11/26 0902) BP: (119-144)/(66-92) 142/81 (11/26 0902) SpO2:  [94 %-97 %] 94 % (11/26 0902) Weight:  [76.2 kg-82.4 kg] 82.4 kg (11/26 0500) Last BM Date:  04/22/21  General:  Alert female in NAD Psych:  Pleasant, cooperative. Normal mood and affect Eyes:  Pupils equal, no icterus. Conjunctive pink Ears:  Normal auditory acuity Nose: No deformity, discharge or lesions Neck:  Supple, no masses felt Lungs:  Clear to auscultation.  Heart:  Regular rate, regular rhythm. No lower extremity edema Abdomen:  Soft, nondistended, nontender, active bowel sounds, no masses felt Rectal :  Dark red blood covering anus.   Msk: Symmetrical without gross deformities.  Neurologic:  Alert, oriented, grossly normal neurologically Skin:  Intact without significant lesions.    Scheduled inpatient medications   Intake/Output from previous day: 11/25 0701 - 11/26 0700 In: 41.6 [I.V.:41.6] Out: -  Intake/Output this shift: No intake/output data recorded.   Lab Results: Recent Labs    04/22/21 2117 04/23/21 0441 04/23/21 0938  WBC 12.1* 10.1  --   HGB 13.3 12.2 12.1  HCT 41.2 36.9 36.7  PLT 241 216  --    BMET Recent Labs    04/22/21 2117 04/23/21 0441  NA 138 137  K 3.8 3.4*  CL 102 102  CO2 28 25  GLUCOSE 132* 127*  BUN 19 16  CREATININE 0.50 0.52  CALCIUM 9.7 8.2*   LFTs Recent Labs    04/23/21 0441  PROT 5.7*  ALBUMIN 3.2*  AST 17  ALT 17  ALKPHOS 68  BILITOT 0.8   PT/INR Recent Labs    04/23/21 0441  LABPROT 13.6  INR 1.0   Hepatitis Panel No results for input(s): HEPBSAG, HCVAB, HEPAIGM, HEPBIGM in the last 72 hours.   . CBC Latest Ref Rng & Units 04/23/2021 04/23/2021 04/22/2021  WBC 4.0 - 10.5 K/uL - 10.1 12.1(H)  Hemoglobin 12.0 - 15.0 g/dL 16.1 09.6 04.5  Hematocrit 36.0 - 46.0 % 36.7 36.9 41.2  Platelets 150 - 400 K/uL - 216 241    . CMP Latest Ref Rng & Units 04/23/2021 04/22/2021  Glucose 70 - 99 mg/dL 409(W) 119(J)  BUN 8 - 23 mg/dL 16 19  Creatinine 4.78 - 1.00 mg/dL 2.95 6.21  Sodium 308 - 145 mmol/L 137 138  Potassium 3.5 - 5.1 mmol/L 3.4(L) 3.8  Chloride 98 - 111 mmol/L 102 102  CO2 22 -  32 mmol/L 25 28  Calcium 8.9 - 10.3 mg/dL 8.2(L) 9.7  Total Protein 6.5 - 8.1 g/dL 6.5(H) 6.7  Total Bilirubin 0.3 - 1.2 mg/dL 0.8 0.5  Alkaline Phos 38 - 126 U/L 68 74  AST 15 - 41 U/L 17 15  ALT 0 - 44 U/L 17 14     Principal Problem:   Acute lower GI bleeding Active Problems:   Leukocytosis   Hyperlipidemia   Hypertension   GERD (gastroesophageal reflux disease)    Willette Cluster, NP-C @  04/23/2021, 11:00 AM

## 2021-04-23 NOTE — Sedation Documentation (Signed)
Pt arrived to IR suite, Dr Grace Isaac speaking to pt and pt husband at this time. Pt is awake alert and oriented x4.

## 2021-04-23 NOTE — Sedation Documentation (Signed)
Pt able to follow breathing instructions for imaging

## 2021-04-24 ENCOUNTER — Encounter (HOSPITAL_COMMUNITY)
Admission: EM | Disposition: A | Discharge status: Home / Self Care | Payer: Self-pay | Source: Home / Self Care | Attending: Internal Medicine

## 2021-04-24 ENCOUNTER — Inpatient Hospital Stay (HOSPITAL_COMMUNITY): Payer: Medicare Other

## 2021-04-24 DIAGNOSIS — K5731 Diverticulosis of large intestine without perforation or abscess with bleeding: Secondary | ICD-10-CM

## 2021-04-24 DIAGNOSIS — D62 Acute posthemorrhagic anemia: Secondary | ICD-10-CM

## 2021-04-24 DIAGNOSIS — K922 Gastrointestinal hemorrhage, unspecified: Secondary | ICD-10-CM | POA: Diagnosis not present

## 2021-04-24 LAB — CBC
HCT: 32.4 % — ABNORMAL LOW (ref 36.0–46.0)
HCT: 32.7 % — ABNORMAL LOW (ref 36.0–46.0)
HCT: 30.5 % — ABNORMAL LOW (ref 36.0–46.0)
HCT: 32.4 % — ABNORMAL LOW (ref 36.0–46.0)
Hemoglobin: 10.3 g/dL — ABNORMAL LOW (ref 12.0–15.0)
Hemoglobin: 10.4 g/dL — ABNORMAL LOW (ref 12.0–15.0)
Hemoglobin: 9.7 g/dL — ABNORMAL LOW (ref 12.0–15.0)
Hemoglobin: 10.2 g/dL — ABNORMAL LOW (ref 12.0–15.0)
MCH: 29.6 pg (ref 26.0–34.0)
MCH: 29.6 pg (ref 26.0–34.0)
MCH: 30 pg (ref 26.0–34.0)
MCH: 29.7 pg (ref 26.0–34.0)
MCHC: 31.8 g/dL (ref 30.0–36.0)
MCHC: 31.8 g/dL (ref 30.0–36.0)
MCHC: 31.8 g/dL (ref 30.0–36.0)
MCHC: 31.5 g/dL (ref 30.0–36.0)
MCV: 93.1 fL (ref 80.0–100.0)
MCV: 93.2 fL (ref 80.0–100.0)
MCV: 94.4 fL (ref 80.0–100.0)
MCV: 94.2 fL (ref 80.0–100.0)
Platelets: 205 10*3/uL (ref 150–400)
Platelets: 178 10*3/uL (ref 150–400)
Platelets: 189 10*3/uL (ref 150–400)
Platelets: 204 10*3/uL (ref 150–400)
RBC: 3.48 MIL/uL — ABNORMAL LOW (ref 3.87–5.11)
RBC: 3.51 MIL/uL — ABNORMAL LOW (ref 3.87–5.11)
RBC: 3.23 MIL/uL — ABNORMAL LOW (ref 3.87–5.11)
RBC: 3.44 MIL/uL — ABNORMAL LOW (ref 3.87–5.11)
RDW: 13.3 % (ref 11.5–15.5)
RDW: 13.4 % (ref 11.5–15.5)
RDW: 13.3 % (ref 11.5–15.5)
RDW: 13.3 % (ref 11.5–15.5)
WBC: 10.5 10*3/uL (ref 4.0–10.5)
WBC: 9.6 10*3/uL (ref 4.0–10.5)
WBC: 10.4 10*3/uL (ref 4.0–10.5)
WBC: 13.2 10*3/uL — ABNORMAL HIGH (ref 4.0–10.5)
nRBC: 0 % (ref 0.0–0.2)
nRBC: 0 % (ref 0.0–0.2)
nRBC: 0 % (ref 0.0–0.2)
nRBC: 0 % (ref 0.0–0.2)

## 2021-04-24 LAB — COMPREHENSIVE METABOLIC PANEL
ALT: 12 U/L (ref 0–44)
ALT: 14 U/L (ref 0–44)
AST: 15 U/L (ref 15–41)
AST: 18 U/L (ref 15–41)
Albumin: 2.9 g/dL — ABNORMAL LOW (ref 3.5–5.0)
Albumin: 2.8 g/dL — ABNORMAL LOW (ref 3.5–5.0)
Alkaline Phosphatase: 59 U/L (ref 38–126)
Alkaline Phosphatase: 59 U/L (ref 38–126)
Anion gap: 7 (ref 5–15)
Anion gap: 4 — ABNORMAL LOW (ref 5–15)
BUN: 18 mg/dL (ref 8–23)
BUN: 17 mg/dL (ref 8–23)
CO2: 22 mmol/L (ref 22–32)
CO2: 24 mmol/L (ref 22–32)
Calcium: 8.1 mg/dL — ABNORMAL LOW (ref 8.9–10.3)
Calcium: 7.8 mg/dL — ABNORMAL LOW (ref 8.9–10.3)
Chloride: 107 mmol/L (ref 98–111)
Chloride: 107 mmol/L (ref 98–111)
Creatinine, Ser: 0.59 mg/dL (ref 0.44–1.00)
Creatinine, Ser: 0.62 mg/dL (ref 0.44–1.00)
GFR, Estimated: >60 mL/min (ref >60)
GFR, Estimated: >60 mL/min (ref >60)
Glucose, Bld: 103 mg/dL — ABNORMAL HIGH (ref 70–99)
Glucose, Bld: 119 mg/dL — ABNORMAL HIGH (ref 70–99)
Potassium: 4 mmol/L (ref 3.5–5.1)
Potassium: 3.7 mmol/L (ref 3.5–5.1)
Sodium: 136 mmol/L (ref 135–145)
Sodium: 135 mmol/L (ref 135–145)
Total Bilirubin: 0.6 mg/dL (ref 0.3–1.2)
Total Bilirubin: 0.7 mg/dL (ref 0.3–1.2)
Total Protein: 5.3 g/dL — ABNORMAL LOW (ref 6.5–8.1)
Total Protein: 5.1 g/dL — ABNORMAL LOW (ref 6.5–8.1)

## 2021-04-24 LAB — TROPONIN I (HIGH SENSITIVITY)
Troponin I (High Sensitivity): 4 ng/L (ref <18)
Troponin I (High Sensitivity): 3 ng/L (ref <18)

## 2021-04-24 LAB — BASIC METABOLIC PANEL
Anion gap: 5 (ref 5–15)
BUN: 15 mg/dL (ref 8–23)
CO2: 24 mmol/L (ref 22–32)
Calcium: 8.1 mg/dL — ABNORMAL LOW (ref 8.9–10.3)
Chloride: 108 mmol/L (ref 98–111)
Creatinine, Ser: 0.5 mg/dL (ref 0.44–1.00)
GFR, Estimated: >60 mL/min (ref >60)
Glucose, Bld: 110 mg/dL — ABNORMAL HIGH (ref 70–99)
Potassium: 3.8 mmol/L (ref 3.5–5.1)
Sodium: 137 mmol/L (ref 135–145)

## 2021-04-24 LAB — MRSA NEXT GEN BY PCR, NASAL: MRSA by PCR Next Gen: NOT DETECTED

## 2021-04-24 LAB — MAGNESIUM: Magnesium: 2.1 mg/dL (ref 1.7–2.4)

## 2021-04-24 SURGERY — COLONOSCOPY WITH PROPOFOL
Anesthesia: Monitor Anesthesia Care

## 2021-04-24 MED ORDER — LIDOCAINE 5 % EX PTCH
1.0000 | MEDICATED_PATCH | CUTANEOUS | Status: DC
Start: 2021-04-24 — End: 2021-04-26
  Administered 2021-04-24 – 2021-04-25 (×2): 1 via TRANSDERMAL
  Filled 2021-04-24 (×2): qty 1

## 2021-04-24 MED ORDER — LACTATED RINGERS IV SOLN: INTRAVENOUS | Status: DC

## 2021-04-24 MED ORDER — ALPRAZOLAM 0.25 MG PO TABS: 0.2500 mg | ORAL_TABLET | Freq: Two times a day (BID) | ORAL | Status: DC | PRN

## 2021-04-24 MED ORDER — SODIUM CHLORIDE 0.9 % IV BOLUS
1000.0000 mL | Freq: Once | INTRAVENOUS | Status: AC
  Administered 2021-04-24: 14:00:00 1000 mL via INTRAVENOUS

## 2021-04-24 NOTE — Plan of Care (Signed)

## 2021-04-24 NOTE — Progress Notes (Signed)
   04/24/21 1400  Clinical Encounter Type  Visited With Patient  Visit Type Code   Responded to a Code.  No family in the room and pt out of code.  Chaplain spoke with pt who expressed feeling anxious.  Pt shared that brother in law also a pt in palliative care and pt's husband was with him.  Chaplain asked nurse to page chaplain if any more needs.

## 2021-04-24 NOTE — Progress Notes (Signed)
Pt transferred to [address] 01 via bed by [place], SWOT and [place], [place]. Family aware. Report given to nurse on 5W by Marchelle Folks.

## 2021-04-24 NOTE — Progress Notes (Signed)
PROGRESS NOTE        PATIENT DETAILS Name: Stephanie Bowers Age: 75 y.o. Sex: female Date of Birth: 1946/05/13 Admit Date: 04/22/2021 Admitting Physician Christel Mormon, MD [id number], Cecille Rubin, MD  Brief Narrative: Patient is a 75 y.o. female with history of HTN, HLD, GERD-who presented with lower GI bleeding-thought to be due to diverticular etiology.  See below for further details.  Subjective: Lying comfortably in bed-denies any chest pain or shortness of breath.  No further hematochezia since yesterday.  Objective: Vitals: Blood pressure 122/77, pulse 74, temperature 98.9 F (37.2 C), temperature source Oral, resp. rate 18, height _0  (1.676 m), weight 78.8 kg, SpO2 94 %.   Exam: Gen Exam:Alert awake-not in any distress HEENT:atraumatic, normocephalic Chest: B/L clear to auscultation anteriorly CVS:S1S2 regular Abdomen:soft non tender, non distended Extremities:no edema Neurology: Non focal Skin: no rash  Pertinent Labs/Radiology: Recent Labs  Lab 04/22/21 2117 04/23/21 0441 04/23/21 0938 04/24/21 0740  WBC 12.1* 10.1   < > 9.6  HGB 13.3 12.2   < > 10.4*  PLT 241 216   < > 178  NA 138 137   < > 136  K 3.8 3.4*   < > 4.0  CREATININE 0.50 0.52   < > 0.59  AST 15 17  --  15  ALT 14 17  --  12  ALKPHOS 74 68  --  59  BILITOT 0.5 0.8  --  0.6   < > = values in this interval not displayed.     Assessment/Plan: Lower GI bleeding with acute blood loss anemia: No further hematochezia-underwent mesenteric angiogram and coil embolization on 11/26.  Slight drop in hemoglobin but relatively stable-no indication for PRBC transfusion.  Plan is to watch another day or so-follow hemoglobin-suspect that if patient continues to be stable-can be discharged home for outpatient GI follow-up.  Will await GI input regarding timing of colonoscopy.  GERD: Continue PPI  HTN: Resume HCTZ over the next few days-BP currently stable.  HLD: Resume statin over  the next few days.  Mood disorder: Resume Lexapro over the next few days.  BMI Estimated body mass index is 28.04 kg/m as calculated from the following:   Height as of this encounter: _1  (1.676 m).   Weight as of this encounter: 78.8 kg.    Procedures: None Consults: GI DVT Prophylaxis: SCD Code Status:Full code  Family Communication: None at bedside  Time spent: 25- minutes-Greater than 50% of this time was spent in counseling, explanation of diagnosis, planning of further management, and coordination of care.  Disposition Plan: Status is: Inpatient  Remains inpatient appropriate because: Diverticular bleeding with acute blood loss anemia-need to ensure stability for another 24 hours before consideration of discharge.    Diet: Diet Order             Diet clear liquid Room service appropriate? Yes; Fluid consistency: Thin  Diet effective now                     Antimicrobial agents: Anti-infectives (From admission, onward)    None        MEDICATIONS: Scheduled Meds:  metoCLOPramide (REGLAN) injection  10 mg Intravenous Once   pantoprazole (PROTONIX) IV  40 mg Intravenous Q24H   Continuous Infusions:  lactated ringers with kcl 50 mL/hr at 04/24/21 0536  PRN Meds:.acetaminophen **OR** acetaminophen, lip balm, ondansetron (ZOFRAN) IV   I have personally reviewed following labs and imaging studies  LABORATORY DATA: CBC: Recent Labs  Lab 04/22/21 2117 04/23/21 0441 04/23/21 0938 04/23/21 1623 04/24/21 0235 04/24/21 0740  WBC 12.1* 10.1  --  11.3* 10.5 9.6  NEUTROABS 10.0*  --   --   --   --   --   HGB 13.3 12.2 12.1 11.7* 10.3* 10.4*  HCT 41.2 36.9 36.7 35.8* 32.4* 32.7*  MCV 91.4 91.3  --  92.5 93.1 93.2  PLT 241 216  --  219 205 034    Basic Metabolic Panel: Recent Labs  Lab 04/22/21 2117 04/23/21 0441 04/24/21 0235 04/24/21 0740  NA 138 137 137 136  K 3.8 3.4* 3.8 4.0  CL 102 102 108 107  CO2 _0 GLUCOSE 132* 127*  110* 103*  BUN _1 CREATININE 0.50 0.52 0.50 0.59  CALCIUM 9.7 8.2* 8.1* 8.1*  MG  --  2.1 2.1  --     GFR: Estimated Creatinine Clearance: 63.4 mL/min (by C-G formula based on SCr of 0.59 mg/dL).  Liver Function Tests: Recent Labs  Lab 04/22/21 2117 04/23/21 0441 04/24/21 0740  AST _2 ALT _3 ALKPHOS 74 68 59  BILITOT 0.5 0.8 0.6  PROT 6.7 5.7* 5.3*  ALBUMIN 4.2 3.2* 2.9*   Recent Labs  Lab 04/22/21 2117  LIPASE 18   No results for input(s): AMMONIA in the last 168 hours.  Coagulation Profile: Recent Labs  Lab 04/23/21 0441  INR 1.0    Cardiac Enzymes: No results for input(s): CKTOTAL, CKMB, CKMBINDEX, TROPONINI in the last 168 hours.  BNP (last 3 results) No results for input(s): PROBNP in the last 8760 hours.  Lipid Profile: No results for input(s): CHOL, HDL, LDLCALC, TRIG, CHOLHDL, LDLDIRECT in the last 72 hours.  Thyroid Function Tests: No results for input(s): TSH, T4TOTAL, FREET4, T3FREE, THYROIDAB in the last 72 hours.  Anemia Panel: No results for input(s): VITAMINB12, FOLATE, FERRITIN, TIBC, IRON, RETICCTPCT in the last 72 hours.  Urine analysis: No results found for: COLORURINE, APPEARANCEUR, LABSPEC, PHURINE, GLUCOSEU, HGBUR, BILIRUBINUR, KETONESUR, PROTEINUR, UROBILINOGEN, NITRITE, LEUKOCYTESUR  Sepsis Labs: Lactic Acid, Venous No results found for: LATICACIDVEN  MICROBIOLOGY: Recent Results (from the past 240 hour(s))  Resp Panel by RT-PCR (Flu A&B, Covid) Nasopharyngeal Swab     Status: None   Collection Time: 04/22/21 11:16 PM   Specimen: Nasopharyngeal Swab; Nasopharyngeal(NP) swabs in vial transport medium  Result Value Ref Range Status   SARS Coronavirus 2 by RT PCR NEGATIVE NEGATIVE Final    Comment: (NOTE) SARS-CoV-2 target nucleic acids are NOT DETECTED.  The SARS-CoV-2 RNA is generally detectable in upper respiratory specimens during the acute phase of infection. The lowest concentration of SARS-CoV-2  viral copies this assay can detect is 138 copies/mL. A negative result does not preclude SARS-Cov-2 infection and should not be used as the sole basis for treatment or other patient management decisions. A negative result may occur with  improper specimen collection/handling, submission of specimen other than nasopharyngeal swab, presence of viral mutation(s) within the areas targeted by this assay, and inadequate number of viral copies(<138 copies/mL). A negative result must be combined with clinical observations, patient history, and epidemiological information. The expected result is Negative.  Fact Sheet for Patients:  EntrepreneurPulse.com.au  Fact Sheet for Healthcare Providers:  IncredibleEmployment.be  This test is no t yet approved  or cleared by the [place] and  has been authorized for detection and/or diagnosis of SARS-CoV-2 by FDA under an Emergency Use Authorization (EUA). This EUA will remain  in effect (meaning this test can be used) for the duration of the COVID-19 declaration under Section 564(b)(1) of the Act, 21 U.S.C.section 360bbb-3(b)(1), unless the authorization is terminated  or revoked sooner.       Influenza A by PCR NEGATIVE NEGATIVE Final   Influenza B by PCR NEGATIVE NEGATIVE Final    Comment: (NOTE) The Xpert Xpress SARS-CoV-2/FLU/RSV plus assay is intended as an aid in the diagnosis of influenza from Nasopharyngeal swab specimens and should not be used as a sole basis for treatment. Nasal washings and aspirates are unacceptable for Xpert Xpress SARS-CoV-2/FLU/RSV testing.  Fact Sheet for Patients: EntrepreneurPulse.com.au  Fact Sheet for Healthcare Providers: IncredibleEmployment.be  This test is not yet approved or cleared by the [place] FDA and has been authorized for detection and/or diagnosis of SARS-CoV-2 by FDA under an Emergency Use Authorization  (EUA). This EUA will remain in effect (meaning this test can be used) for the duration of the COVID-19 declaration under Section 564(b)(1) of the Act, 21 U.S.C. section 360bbb-3(b)(1), unless the authorization is terminated or revoked.  Performed at [facility], [address] [place], [place] [address]     RADIOLOGY STUDIES/RESULTS: IR Angiogram Visceral Selective  Result Date: 04/24/2021 INDICATION: Acute lower GI bleeding. Positive CTA for active diverticular bleeding within the distal descending/proximal sigmoid colon. Please perform mesenteric arteriogram and percutaneous embolization as indicated. EXAM: 1. ULTRASOUND GUIDANCE FOR ARTERIAL ACCESS 2. SELECTIVE SUPERIOR MESENTERIC ARTERIOGRAM 3. FLUSH AORTOGRAM 4. SELECTIVE INFERIOR MESENTERIC ARTERIOGRAM 5. COMPARISON:  CTA abdomen and pelvis - earlier same day MEDICATIONS: None ANESTHESIA/SEDATION: Moderate (conscious) sedation was employed during this procedure as administered by the Interventional Radiology RN. A total of Versed 4 mg and Fentanyl 100 mcg was administered intravenously. Moderate Sedation Time: 60 minutes. The patient's level of consciousness and vital signs were monitored continuously by radiology nursing throughout the procedure under my direct supervision. CONTRAST:  60 cc Omnipaque 300 FLUOROSCOPY TIME:  19.6 minutes (563 mGy) COMPLICATIONS: None immediate. PROCEDURE: Informed consent was obtained from the patient following explanation of the procedure, risks, benefits and alternatives. All questions were addressed. A time out was performed prior to the initiation of the procedure. Maximal barrier sterile technique utilized including caps, mask, sterile gowns, sterile gloves, large sterile drape, hand hygiene, and Betadine prep. The right femoral head was marked fluoroscopically. Under sterile conditions and local anesthesia, the right common femoral artery access was performed with a micropuncture  needle. Under direct ultrasound guidance, the right common femoral was accessed with a micropuncture kit. An ultrasound image was saved for documentation purposes. This allowed for placement of a 5-French vascular sheath. A limited arteriogram was performed through the side arm of the sheath confirming appropriate access within the right common femoral artery. Over a Bentson wire, a Mickelson catheter was advanced the caudal aspect of the thoracic aorta where was reformed, back bled and flushed. The Mickelson catheter was then utilized to select the superior mesenteric artery and a selective superior mesenteric arteriogram was performed. Next, the Mickelson catheter was utilized to attempt to cannulate the inferior mesenteric artery however this proved challenging given tortuosity of the abdominal aorta. As such, over Bentson wire, the Mickelson catheter was exchanged for an Omni Flush catheter and a flush abdominal aortogram was performed demarcating the origin of the IMA. Next, with  the use of a 5 French angled glide catheter, the IMA was selected and a selective inferior mesenteric arteriogram was performed. With the use of a fathom 14 microwire, a regular Renegade microcatheter was advanced to the level of the trifurcation of the IMA and a sub selective inferior mesenteric arteriogram was performed. The microcatheter was then advanced to select the sigmoidal artery and a selective sigmoidal arteriogram was performed. The microcatheter was advanced into the distal arcade of the sigmoidal artery, beyond the location of the distal tributaries supplying the ill-defined area of contrast extravasation within the distal descending/proximal sigmoid colon. Contrast injection confirmed appropriate positioning and the short-segment of the distal arcade was percutaneously coil embolized with overlapping 2 mm diameter interlock coils. The microcatheter was retracted to the level of the sigmoidal artery and a post  embolization sigmoidal arteriogram was performed. The microcatheter was then utilized to select the left colic artery and a selective left colic arteriogram was performed Images were reviewed and the procedure was terminated. All wires, catheters and sheaths were removed from the patient. Hemostasis was achieved at the right groin access site with manual compression. The patient tolerated the procedure well without immediate post procedural complication. FINDINGS: Selective inferior mesenteric arteriogram demonstrates a conventional branching pattern and is negative for significant arterial supply to the distal descending/proximal sigmoid colon. No discrete areas of vessel irregularity or contrast extravasation are identified. Flush abdominal aortogram demonstrates patency of the IMA. The abdominal aorta is noted to be tortuous but of normal caliber as are the bilateral common and internal iliac arteries. Selective inferior mesenteric arteriogram demonstrates a conventional branching pattern. A ill-defined area of contrast extravasation is seen at the level of the distal descending/proximal sigmoid colon, correlating with the findings on preceding CTA. Dominant arterial supply to this area of contrast extravasation is via the sigmoidal artery. Sub selective sigmoidal arteriogram confirms an ill-defined area of contrast extravasation supplied via a tiny tertiary branch from the distal arcade of the sigmoidal artery. The distal arcade was percutaneously coil embolized across the origin of the tiny tertiary branch supplying the ill-defined area of contrast extravasation. Post embolization selective arteriograms performed both from the level of the sigmoidal and left colic arteries are negative for persistent active extravasation. IMPRESSION: Technically successful percutaneous coil embolization of a distal arcade of the sigmoidal artery across the origin of a tiny tertiary branch supplying an ill-defined area of  contrast extravasation at the level of the distal descending/proximal sigmoid colon, correlating with the findings seen on preceding CTA. PLAN: - The patient is to remain flat for 4 hours with right leg straight. - The patient will continue to experience several additional bloody bowel movements and may continue to require additional resuscitation (as she was bleeding both before and during the procedure), however ultimately I am hopeful she will stabilize in the coming days. - While presumably secondary to diverticular disease, repeat colonoscopy after the resolution of acute symptoms is advised to exclude the presence of an underlying mass/lesion. Electronically Signed   By: Sandi Mariscal M.D.   On: 04/24/2021 08:53   IR Angiogram Visceral Selective  Result Date: 04/24/2021 INDICATION: Acute lower GI bleeding. Positive CTA for active diverticular bleeding within the distal descending/proximal sigmoid colon. Please perform mesenteric arteriogram and percutaneous embolization as indicated. EXAM: 1. ULTRASOUND GUIDANCE FOR ARTERIAL ACCESS 2. SELECTIVE SUPERIOR MESENTERIC ARTERIOGRAM 3. FLUSH AORTOGRAM 4. SELECTIVE INFERIOR MESENTERIC ARTERIOGRAM 5. COMPARISON:  CTA abdomen and pelvis - earlier same day MEDICATIONS: None ANESTHESIA/SEDATION: Moderate (conscious) sedation was  employed during this procedure as administered by the Interventional Radiology RN. A total of Versed 4 mg and Fentanyl 100 mcg was administered intravenously. Moderate Sedation Time: 60 minutes. The patient's level of consciousness and vital signs were monitored continuously by radiology nursing throughout the procedure under my direct supervision. CONTRAST:  60 cc Omnipaque 300 FLUOROSCOPY TIME:  19.6 minutes (791 mGy) COMPLICATIONS: None immediate. PROCEDURE: Informed consent was obtained from the patient following explanation of the procedure, risks, benefits and alternatives. All questions were addressed. A time out was performed prior to  the initiation of the procedure. Maximal barrier sterile technique utilized including caps, mask, sterile gowns, sterile gloves, large sterile drape, hand hygiene, and Betadine prep. The right femoral head was marked fluoroscopically. Under sterile conditions and local anesthesia, the right common femoral artery access was performed with a micropuncture needle. Under direct ultrasound guidance, the right common femoral was accessed with a micropuncture kit. An ultrasound image was saved for documentation purposes. This allowed for placement of a 5-French vascular sheath. A limited arteriogram was performed through the side arm of the sheath confirming appropriate access within the right common femoral artery. Over a Bentson wire, a Mickelson catheter was advanced the caudal aspect of the thoracic aorta where was reformed, back bled and flushed. The Mickelson catheter was then utilized to select the superior mesenteric artery and a selective superior mesenteric arteriogram was performed. Next, the Mickelson catheter was utilized to attempt to cannulate the inferior mesenteric artery however this proved challenging given tortuosity of the abdominal aorta. As such, over Bentson wire, the Mickelson catheter was exchanged for an Omni Flush catheter and a flush abdominal aortogram was performed demarcating the origin of the IMA. Next, with the use of a 5 French angled glide catheter, the IMA was selected and a selective inferior mesenteric arteriogram was performed. With the use of a fathom 14 microwire, a regular Renegade microcatheter was advanced to the level of the trifurcation of the IMA and a sub selective inferior mesenteric arteriogram was performed. The microcatheter was then advanced to select the sigmoidal artery and a selective sigmoidal arteriogram was performed. The microcatheter was advanced into the distal arcade of the sigmoidal artery, beyond the location of the distal tributaries supplying the  ill-defined area of contrast extravasation within the distal descending/proximal sigmoid colon. Contrast injection confirmed appropriate positioning and the short-segment of the distal arcade was percutaneously coil embolized with overlapping 2 mm diameter interlock coils. The microcatheter was retracted to the level of the sigmoidal artery and a post embolization sigmoidal arteriogram was performed. The microcatheter was then utilized to select the left colic artery and a selective left colic arteriogram was performed Images were reviewed and the procedure was terminated. All wires, catheters and sheaths were removed from the patient. Hemostasis was achieved at the right groin access site with manual compression. The patient tolerated the procedure well without immediate post procedural complication. FINDINGS: Selective inferior mesenteric arteriogram demonstrates a conventional branching pattern and is negative for significant arterial supply to the distal descending/proximal sigmoid colon. No discrete areas of vessel irregularity or contrast extravasation are identified. Flush abdominal aortogram demonstrates patency of the IMA. The abdominal aorta is noted to be tortuous but of normal caliber as are the bilateral common and internal iliac arteries. Selective inferior mesenteric arteriogram demonstrates a conventional branching pattern. A ill-defined area of contrast extravasation is seen at the level of the distal descending/proximal sigmoid colon, correlating with the findings on preceding CTA. Dominant arterial supply to this  area of contrast extravasation is via the sigmoidal artery. Sub selective sigmoidal arteriogram confirms an ill-defined area of contrast extravasation supplied via a tiny tertiary branch from the distal arcade of the sigmoidal artery. The distal arcade was percutaneously coil embolized across the origin of the tiny tertiary branch supplying the ill-defined area of contrast extravasation.  Post embolization selective arteriograms performed both from the level of the sigmoidal and left colic arteries are negative for persistent active extravasation. IMPRESSION: Technically successful percutaneous coil embolization of a distal arcade of the sigmoidal artery across the origin of a tiny tertiary branch supplying an ill-defined area of contrast extravasation at the level of the distal descending/proximal sigmoid colon, correlating with the findings seen on preceding CTA. PLAN: - The patient is to remain flat for 4 hours with right leg straight. - The patient will continue to experience several additional bloody bowel movements and may continue to require additional resuscitation (as she was bleeding both before and during the procedure), however ultimately I am hopeful she will stabilize in the coming days. - While presumably secondary to diverticular disease, repeat colonoscopy after the resolution of acute symptoms is advised to exclude the presence of an underlying mass/lesion. Electronically Signed   By: Sandi Mariscal M.D.   On: 04/24/2021 08:53   IR Angiogram Selective Each Additional Vessel  Result Date: 04/24/2021 INDICATION: Acute lower GI bleeding. Positive CTA for active diverticular bleeding within the distal descending/proximal sigmoid colon. Please perform mesenteric arteriogram and percutaneous embolization as indicated. EXAM: 1. ULTRASOUND GUIDANCE FOR ARTERIAL ACCESS 2. SELECTIVE SUPERIOR MESENTERIC ARTERIOGRAM 3. FLUSH AORTOGRAM 4. SELECTIVE INFERIOR MESENTERIC ARTERIOGRAM 5. COMPARISON:  CTA abdomen and pelvis - earlier same day MEDICATIONS: None ANESTHESIA/SEDATION: Moderate (conscious) sedation was employed during this procedure as administered by the Interventional Radiology RN. A total of Versed 4 mg and Fentanyl 100 mcg was administered intravenously. Moderate Sedation Time: 60 minutes. The patient's level of consciousness and vital signs were monitored continuously by radiology  nursing throughout the procedure under my direct supervision. CONTRAST:  60 cc Omnipaque 300 FLUOROSCOPY TIME:  19.6 minutes (599 mGy) COMPLICATIONS: None immediate. PROCEDURE: Informed consent was obtained from the patient following explanation of the procedure, risks, benefits and alternatives. All questions were addressed. A time out was performed prior to the initiation of the procedure. Maximal barrier sterile technique utilized including caps, mask, sterile gowns, sterile gloves, large sterile drape, hand hygiene, and Betadine prep. The right femoral head was marked fluoroscopically. Under sterile conditions and local anesthesia, the right common femoral artery access was performed with a micropuncture needle. Under direct ultrasound guidance, the right common femoral was accessed with a micropuncture kit. An ultrasound image was saved for documentation purposes. This allowed for placement of a 5-French vascular sheath. A limited arteriogram was performed through the side arm of the sheath confirming appropriate access within the right common femoral artery. Over a Bentson wire, a Mickelson catheter was advanced the caudal aspect of the thoracic aorta where was reformed, back bled and flushed. The Mickelson catheter was then utilized to select the superior mesenteric artery and a selective superior mesenteric arteriogram was performed. Next, the Mickelson catheter was utilized to attempt to cannulate the inferior mesenteric artery however this proved challenging given tortuosity of the abdominal aorta. As such, over Bentson wire, the Mickelson catheter was exchanged for an Omni Flush catheter and a flush abdominal aortogram was performed demarcating the origin of the IMA. Next, with the use of a 5 French angled glide catheter, the  IMA was selected and a selective inferior mesenteric arteriogram was performed. With the use of a fathom 14 microwire, a regular Renegade microcatheter was advanced to the level of  the trifurcation of the IMA and a sub selective inferior mesenteric arteriogram was performed. The microcatheter was then advanced to select the sigmoidal artery and a selective sigmoidal arteriogram was performed. The microcatheter was advanced into the distal arcade of the sigmoidal artery, beyond the location of the distal tributaries supplying the ill-defined area of contrast extravasation within the distal descending/proximal sigmoid colon. Contrast injection confirmed appropriate positioning and the short-segment of the distal arcade was percutaneously coil embolized with overlapping 2 mm diameter interlock coils. The microcatheter was retracted to the level of the sigmoidal artery and a post embolization sigmoidal arteriogram was performed. The microcatheter was then utilized to select the left colic artery and a selective left colic arteriogram was performed Images were reviewed and the procedure was terminated. All wires, catheters and sheaths were removed from the patient. Hemostasis was achieved at the right groin access site with manual compression. The patient tolerated the procedure well without immediate post procedural complication. FINDINGS: Selective inferior mesenteric arteriogram demonstrates a conventional branching pattern and is negative for significant arterial supply to the distal descending/proximal sigmoid colon. No discrete areas of vessel irregularity or contrast extravasation are identified. Flush abdominal aortogram demonstrates patency of the IMA. The abdominal aorta is noted to be tortuous but of normal caliber as are the bilateral common and internal iliac arteries. Selective inferior mesenteric arteriogram demonstrates a conventional branching pattern. A ill-defined area of contrast extravasation is seen at the level of the distal descending/proximal sigmoid colon, correlating with the findings on preceding CTA. Dominant arterial supply to this area of contrast extravasation is via  the sigmoidal artery. Sub selective sigmoidal arteriogram confirms an ill-defined area of contrast extravasation supplied via a tiny tertiary branch from the distal arcade of the sigmoidal artery. The distal arcade was percutaneously coil embolized across the origin of the tiny tertiary branch supplying the ill-defined area of contrast extravasation. Post embolization selective arteriograms performed both from the level of the sigmoidal and left colic arteries are negative for persistent active extravasation. IMPRESSION: Technically successful percutaneous coil embolization of a distal arcade of the sigmoidal artery across the origin of a tiny tertiary branch supplying an ill-defined area of contrast extravasation at the level of the distal descending/proximal sigmoid colon, correlating with the findings seen on preceding CTA. PLAN: - The patient is to remain flat for 4 hours with right leg straight. - The patient will continue to experience several additional bloody bowel movements and may continue to require additional resuscitation (as she was bleeding both before and during the procedure), however ultimately I am hopeful she will stabilize in the coming days. - While presumably secondary to diverticular disease, repeat colonoscopy after the resolution of acute symptoms is advised to exclude the presence of an underlying mass/lesion. Electronically Signed   By: Sandi Mariscal M.D.   On: 04/24/2021 08:53   IR Angiogram Selective Each Additional Vessel  Result Date: 04/24/2021 INDICATION: Acute lower GI bleeding. Positive CTA for active diverticular bleeding within the distal descending/proximal sigmoid colon. Please perform mesenteric arteriogram and percutaneous embolization as indicated. EXAM: 1. ULTRASOUND GUIDANCE FOR ARTERIAL ACCESS 2. SELECTIVE SUPERIOR MESENTERIC ARTERIOGRAM 3. FLUSH AORTOGRAM 4. SELECTIVE INFERIOR MESENTERIC ARTERIOGRAM 5. COMPARISON:  CTA abdomen and pelvis - earlier same day  MEDICATIONS: None ANESTHESIA/SEDATION: Moderate (conscious) sedation was employed during this procedure as administered by  the Interventional Radiology RN. A total of Versed 4 mg and Fentanyl 100 mcg was administered intravenously. Moderate Sedation Time: 60 minutes. The patient's level of consciousness and vital signs were monitored continuously by radiology nursing throughout the procedure under my direct supervision. CONTRAST:  60 cc Omnipaque 300 FLUOROSCOPY TIME:  19.6 minutes (846 mGy) COMPLICATIONS: None immediate. PROCEDURE: Informed consent was obtained from the patient following explanation of the procedure, risks, benefits and alternatives. All questions were addressed. A time out was performed prior to the initiation of the procedure. Maximal barrier sterile technique utilized including caps, mask, sterile gowns, sterile gloves, large sterile drape, hand hygiene, and Betadine prep. The right femoral head was marked fluoroscopically. Under sterile conditions and local anesthesia, the right common femoral artery access was performed with a micropuncture needle. Under direct ultrasound guidance, the right common femoral was accessed with a micropuncture kit. An ultrasound image was saved for documentation purposes. This allowed for placement of a 5-French vascular sheath. A limited arteriogram was performed through the side arm of the sheath confirming appropriate access within the right common femoral artery. Over a Bentson wire, a Mickelson catheter was advanced the caudal aspect of the thoracic aorta where was reformed, back bled and flushed. The Mickelson catheter was then utilized to select the superior mesenteric artery and a selective superior mesenteric arteriogram was performed. Next, the Mickelson catheter was utilized to attempt to cannulate the inferior mesenteric artery however this proved challenging given tortuosity of the abdominal aorta. As such, over Bentson wire, the Mickelson catheter  was exchanged for an Omni Flush catheter and a flush abdominal aortogram was performed demarcating the origin of the IMA. Next, with the use of a 5 French angled glide catheter, the IMA was selected and a selective inferior mesenteric arteriogram was performed. With the use of a fathom 14 microwire, a regular Renegade microcatheter was advanced to the level of the trifurcation of the IMA and a sub selective inferior mesenteric arteriogram was performed. The microcatheter was then advanced to select the sigmoidal artery and a selective sigmoidal arteriogram was performed. The microcatheter was advanced into the distal arcade of the sigmoidal artery, beyond the location of the distal tributaries supplying the ill-defined area of contrast extravasation within the distal descending/proximal sigmoid colon. Contrast injection confirmed appropriate positioning and the short-segment of the distal arcade was percutaneously coil embolized with overlapping 2 mm diameter interlock coils. The microcatheter was retracted to the level of the sigmoidal artery and a post embolization sigmoidal arteriogram was performed. The microcatheter was then utilized to select the left colic artery and a selective left colic arteriogram was performed Images were reviewed and the procedure was terminated. All wires, catheters and sheaths were removed from the patient. Hemostasis was achieved at the right groin access site with manual compression. The patient tolerated the procedure well without immediate post procedural complication. FINDINGS: Selective inferior mesenteric arteriogram demonstrates a conventional branching pattern and is negative for significant arterial supply to the distal descending/proximal sigmoid colon. No discrete areas of vessel irregularity or contrast extravasation are identified. Flush abdominal aortogram demonstrates patency of the IMA. The abdominal aorta is noted to be tortuous but of normal caliber as are the  bilateral common and internal iliac arteries. Selective inferior mesenteric arteriogram demonstrates a conventional branching pattern. A ill-defined area of contrast extravasation is seen at the level of the distal descending/proximal sigmoid colon, correlating with the findings on preceding CTA. Dominant arterial supply to this area of contrast extravasation is via the  sigmoidal artery. Sub selective sigmoidal arteriogram confirms an ill-defined area of contrast extravasation supplied via a tiny tertiary branch from the distal arcade of the sigmoidal artery. The distal arcade was percutaneously coil embolized across the origin of the tiny tertiary branch supplying the ill-defined area of contrast extravasation. Post embolization selective arteriograms performed both from the level of the sigmoidal and left colic arteries are negative for persistent active extravasation. IMPRESSION: Technically successful percutaneous coil embolization of a distal arcade of the sigmoidal artery across the origin of a tiny tertiary branch supplying an ill-defined area of contrast extravasation at the level of the distal descending/proximal sigmoid colon, correlating with the findings seen on preceding CTA. PLAN: - The patient is to remain flat for 4 hours with right leg straight. - The patient will continue to experience several additional bloody bowel movements and may continue to require additional resuscitation (as she was bleeding both before and during the procedure), however ultimately I am hopeful she will stabilize in the coming days. - While presumably secondary to diverticular disease, repeat colonoscopy after the resolution of acute symptoms is advised to exclude the presence of an underlying mass/lesion. Electronically Signed   By: Sandi Mariscal M.D.   On: 04/24/2021 08:53   IR US Guide Vasc Access Right  Result Date: 04/24/2021 INDICATION: Acute lower GI bleeding. Positive CTA for active diverticular bleeding within  the distal descending/proximal sigmoid colon. Please perform mesenteric arteriogram and percutaneous embolization as indicated. EXAM: 1. ULTRASOUND GUIDANCE FOR ARTERIAL ACCESS 2. SELECTIVE SUPERIOR MESENTERIC ARTERIOGRAM 3. FLUSH AORTOGRAM 4. SELECTIVE INFERIOR MESENTERIC ARTERIOGRAM 5. COMPARISON:  CTA abdomen and pelvis - earlier same day MEDICATIONS: None ANESTHESIA/SEDATION: Moderate (conscious) sedation was employed during this procedure as administered by the Interventional Radiology RN. A total of Versed 4 mg and Fentanyl 100 mcg was administered intravenously. Moderate Sedation Time: 60 minutes. The patient's level of consciousness and vital signs were monitored continuously by radiology nursing throughout the procedure under my direct supervision. CONTRAST:  60 cc Omnipaque 300 FLUOROSCOPY TIME:  19.6 minutes (093 mGy) COMPLICATIONS: None immediate. PROCEDURE: Informed consent was obtained from the patient following explanation of the procedure, risks, benefits and alternatives. All questions were addressed. A time out was performed prior to the initiation of the procedure. Maximal barrier sterile technique utilized including caps, mask, sterile gowns, sterile gloves, large sterile drape, hand hygiene, and Betadine prep. The right femoral head was marked fluoroscopically. Under sterile conditions and local anesthesia, the right common femoral artery access was performed with a micropuncture needle. Under direct ultrasound guidance, the right common femoral was accessed with a micropuncture kit. An ultrasound image was saved for documentation purposes. This allowed for placement of a 5-French vascular sheath. A limited arteriogram was performed through the side arm of the sheath confirming appropriate access within the right common femoral artery. Over a Bentson wire, a Mickelson catheter was advanced the caudal aspect of the thoracic aorta where was reformed, back bled and flushed. The Mickelson catheter  was then utilized to select the superior mesenteric artery and a selective superior mesenteric arteriogram was performed. Next, the Mickelson catheter was utilized to attempt to cannulate the inferior mesenteric artery however this proved challenging given tortuosity of the abdominal aorta. As such, over Bentson wire, the Mickelson catheter was exchanged for an Omni Flush catheter and a flush abdominal aortogram was performed demarcating the origin of the IMA. Next, with the use of a 5 French angled glide catheter, the IMA was selected and a selective inferior  mesenteric arteriogram was performed. With the use of a fathom 14 microwire, a regular Renegade microcatheter was advanced to the level of the trifurcation of the IMA and a sub selective inferior mesenteric arteriogram was performed. The microcatheter was then advanced to select the sigmoidal artery and a selective sigmoidal arteriogram was performed. The microcatheter was advanced into the distal arcade of the sigmoidal artery, beyond the location of the distal tributaries supplying the ill-defined area of contrast extravasation within the distal descending/proximal sigmoid colon. Contrast injection confirmed appropriate positioning and the short-segment of the distal arcade was percutaneously coil embolized with overlapping 2 mm diameter interlock coils. The microcatheter was retracted to the level of the sigmoidal artery and a post embolization sigmoidal arteriogram was performed. The microcatheter was then utilized to select the left colic artery and a selective left colic arteriogram was performed Images were reviewed and the procedure was terminated. All wires, catheters and sheaths were removed from the patient. Hemostasis was achieved at the right groin access site with manual compression. The patient tolerated the procedure well without immediate post procedural complication. FINDINGS: Selective inferior mesenteric arteriogram demonstrates a  conventional branching pattern and is negative for significant arterial supply to the distal descending/proximal sigmoid colon. No discrete areas of vessel irregularity or contrast extravasation are identified. Flush abdominal aortogram demonstrates patency of the IMA. The abdominal aorta is noted to be tortuous but of normal caliber as are the bilateral common and internal iliac arteries. Selective inferior mesenteric arteriogram demonstrates a conventional branching pattern. A ill-defined area of contrast extravasation is seen at the level of the distal descending/proximal sigmoid colon, correlating with the findings on preceding CTA. Dominant arterial supply to this area of contrast extravasation is via the sigmoidal artery. Sub selective sigmoidal arteriogram confirms an ill-defined area of contrast extravasation supplied via a tiny tertiary branch from the distal arcade of the sigmoidal artery. The distal arcade was percutaneously coil embolized across the origin of the tiny tertiary branch supplying the ill-defined area of contrast extravasation. Post embolization selective arteriograms performed both from the level of the sigmoidal and left colic arteries are negative for persistent active extravasation. IMPRESSION: Technically successful percutaneous coil embolization of a distal arcade of the sigmoidal artery across the origin of a tiny tertiary branch supplying an ill-defined area of contrast extravasation at the level of the distal descending/proximal sigmoid colon, correlating with the findings seen on preceding CTA. PLAN: - The patient is to remain flat for 4 hours with right leg straight. - The patient will continue to experience several additional bloody bowel movements and may continue to require additional resuscitation (as she was bleeding both before and during the procedure), however ultimately I am hopeful she will stabilize in the coming days. - While presumably secondary to diverticular  disease, repeat colonoscopy after the resolution of acute symptoms is advised to exclude the presence of an underlying mass/lesion. Electronically Signed   By: Sandi Mariscal M.D.   On: 04/24/2021 08:53   IR EMBO ARTERIAL NOT HEMORR HEMANG INC GUIDE ROADMAPPING  Result Date: 04/24/2021 INDICATION: Acute lower GI bleeding. Positive CTA for active diverticular bleeding within the distal descending/proximal sigmoid colon. Please perform mesenteric arteriogram and percutaneous embolization as indicated. EXAM: 1. ULTRASOUND GUIDANCE FOR ARTERIAL ACCESS 2. SELECTIVE SUPERIOR MESENTERIC ARTERIOGRAM 3. FLUSH AORTOGRAM 4. SELECTIVE INFERIOR MESENTERIC ARTERIOGRAM 5. COMPARISON:  CTA abdomen and pelvis - earlier same day MEDICATIONS: None ANESTHESIA/SEDATION: Moderate (conscious) sedation was employed during this procedure as administered by the Interventional Radiology RN. A  total of Versed 4 mg and Fentanyl 100 mcg was administered intravenously. Moderate Sedation Time: 60 minutes. The patient's level of consciousness and vital signs were monitored continuously by radiology nursing throughout the procedure under my direct supervision. CONTRAST:  60 cc Omnipaque 300 FLUOROSCOPY TIME:  19.6 minutes (546 mGy) COMPLICATIONS: None immediate. PROCEDURE: Informed consent was obtained from the patient following explanation of the procedure, risks, benefits and alternatives. All questions were addressed. A time out was performed prior to the initiation of the procedure. Maximal barrier sterile technique utilized including caps, mask, sterile gowns, sterile gloves, large sterile drape, hand hygiene, and Betadine prep. The right femoral head was marked fluoroscopically. Under sterile conditions and local anesthesia, the right common femoral artery access was performed with a micropuncture needle. Under direct ultrasound guidance, the right common femoral was accessed with a micropuncture kit. An ultrasound image was saved for  documentation purposes. This allowed for placement of a 5-French vascular sheath. A limited arteriogram was performed through the side arm of the sheath confirming appropriate access within the right common femoral artery. Over a Bentson wire, a Mickelson catheter was advanced the caudal aspect of the thoracic aorta where was reformed, back bled and flushed. The Mickelson catheter was then utilized to select the superior mesenteric artery and a selective superior mesenteric arteriogram was performed. Next, the Mickelson catheter was utilized to attempt to cannulate the inferior mesenteric artery however this proved challenging given tortuosity of the abdominal aorta. As such, over Bentson wire, the Mickelson catheter was exchanged for an Omni Flush catheter and a flush abdominal aortogram was performed demarcating the origin of the IMA. Next, with the use of a 5 French angled glide catheter, the IMA was selected and a selective inferior mesenteric arteriogram was performed. With the use of a fathom 14 microwire, a regular Renegade microcatheter was advanced to the level of the trifurcation of the IMA and a sub selective inferior mesenteric arteriogram was performed. The microcatheter was then advanced to select the sigmoidal artery and a selective sigmoidal arteriogram was performed. The microcatheter was advanced into the distal arcade of the sigmoidal artery, beyond the location of the distal tributaries supplying the ill-defined area of contrast extravasation within the distal descending/proximal sigmoid colon. Contrast injection confirmed appropriate positioning and the short-segment of the distal arcade was percutaneously coil embolized with overlapping 2 mm diameter interlock coils. The microcatheter was retracted to the level of the sigmoidal artery and a post embolization sigmoidal arteriogram was performed. The microcatheter was then utilized to select the left colic artery and a selective left colic  arteriogram was performed Images were reviewed and the procedure was terminated. All wires, catheters and sheaths were removed from the patient. Hemostasis was achieved at the right groin access site with manual compression. The patient tolerated the procedure well without immediate post procedural complication. FINDINGS: Selective inferior mesenteric arteriogram demonstrates a conventional branching pattern and is negative for significant arterial supply to the distal descending/proximal sigmoid colon. No discrete areas of vessel irregularity or contrast extravasation are identified. Flush abdominal aortogram demonstrates patency of the IMA. The abdominal aorta is noted to be tortuous but of normal caliber as are the bilateral common and internal iliac arteries. Selective inferior mesenteric arteriogram demonstrates a conventional branching pattern. A ill-defined area of contrast extravasation is seen at the level of the distal descending/proximal sigmoid colon, correlating with the findings on preceding CTA. Dominant arterial supply to this area of contrast extravasation is via the sigmoidal artery. Sub selective sigmoidal  arteriogram confirms an ill-defined area of contrast extravasation supplied via a tiny tertiary branch from the distal arcade of the sigmoidal artery. The distal arcade was percutaneously coil embolized across the origin of the tiny tertiary branch supplying the ill-defined area of contrast extravasation. Post embolization selective arteriograms performed both from the level of the sigmoidal and left colic arteries are negative for persistent active extravasation. IMPRESSION: Technically successful percutaneous coil embolization of a distal arcade of the sigmoidal artery across the origin of a tiny tertiary branch supplying an ill-defined area of contrast extravasation at the level of the distal descending/proximal sigmoid colon, correlating with the findings seen on preceding CTA. PLAN: - The  patient is to remain flat for 4 hours with right leg straight. - The patient will continue to experience several additional bloody bowel movements and may continue to require additional resuscitation (as she was bleeding both before and during the procedure), however ultimately I am hopeful she will stabilize in the coming days. - While presumably secondary to diverticular disease, repeat colonoscopy after the resolution of acute symptoms is advised to exclude the presence of an underlying mass/lesion. Electronically Signed   By: Simonne Come M.D.   On: 04/24/2021 08:53   CT Angio Abd/Pel w/ and/or w/o  Result Date: 04/23/2021 CLINICAL DATA:  GI bleed, bright red blood per rectum EXAM: CTA ABDOMEN AND PELVIS WITHOUT AND WITH CONTRAST TECHNIQUE: Multidetector CT imaging of the abdomen and pelvis was performed using the standard protocol during bolus administration of intravenous contrast. Multiplanar reconstructed images and MIPs were obtained and reviewed to evaluate the vascular anatomy. CONTRAST:  OMNIPAQUE IOHEXOL 350 MG/ML SOLN IV COMPARISON:  None FINDINGS: VASCULAR Aorta: Atherosclerotic calcifications of tortuous abdominal aorta without aneurysm. No intramural hematoma on precontrast imaging. No dissection. No significant thrombus. Celiac: Mild plaque at origin.  No significant stenosis. SMA: Widely patent Renals: Plaque formation at BILATERAL renal artery origins, not significant on LEFT, estimated 50% on RIGHT. IMA: Patent origin Inflow: Atherosclerotic calcifications and tortuosity of common iliac arteries. Additional calcified plaque within internal iliac arteries bilaterally. Proximal Outflow: Mild plaque at common femoral arteries and bifurcations Veins: Patent Review of the MIP images confirms the above findings. NON-VASCULAR Lower chest: Lung bases clear Hepatobiliary: Cyst at central liver 2.2 x 1.7 cm series 6, image 16. Gallbladder and liver otherwise unremarkable. Pancreas: Normal  appearance Spleen: Normal appearance Adrenals/Urinary Tract: Tiny BILATERAL renal cysts. Adrenal glands, kidneys, ureters, and bladder otherwise normal appearance. Stomach/Bowel: Diffuse diverticulosis of descending and sigmoid colon. Active site of IV contrast extravasation identified at the distal descending colon near the descending sigmoid junction, where a single diverticulum which is low attenuation on precontrast imaging demonstrates high attenuation postcontrast, and delayed images showing passage of extravasated blood into the colonic lumen. Findings consistent with a site of active GI bleeding. No additional sites of abnormal contrast extravasation identified. Stomach and remaining bowel loops unremarkable. Lymphatic: No adenopathy Reproductive: Unremarkable uterus and ovaries Other: No free air or free fluid.  No hernia. Musculoskeletal: Diffuse osseous demineralization. IMPRESSION: VASCULAR Scattered atherosclerotic plaque formation without evidence of critical stenosis. 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 No evidence of aneurysm or dissection. NON-VASCULAR Active site of IV contrast extravasation at a diverticulum at the distal descending colon near the descending sigmoid junction with contrast extending in to the colonic lumen on delayed images consistent with active GI bleeding. Aortic Atherosclerosis (ICD10-I70.0). Findings discussed with Dr. Orvan Falconer on 04/23/2021 at 1943 hours. Electronically Signed   By: Loraine Leriche  Thornton Papas M.D.   On: 04/23/2021 19:51     LOS: 1 day   Oren Binet, MD  [facility]    To contact the attending provider between 7A-7P or the covering provider during after hours 7P-7A, please log into the web site [email or web address] and access using universal [place] password for that web site. If you do not have the password, please call the hospital operator.  04/24/2021, 11:09 AM

## 2021-04-24 NOTE — Progress Notes (Signed)
Patient syncopized following a episode of bloody (dark blood) BM She quickly regained conciousness-BP in the high 80's systolic. Tele-no arrythmias  Unclear whether this is from ongoing GI bleeding or from a vagal episode  Plan IVF Bolus CBC Monitor in tele If she has another large Bloody BM-may need to repeat CT Angio Upgrade to PCU

## 2021-04-24 NOTE — Progress Notes (Signed)
Pt complaining of pain in right thoracic area.  MD notified.  New orders placed.  In addition, pt had 3 runs of nonsustained sinus tach (140-150).  MD notified.  Will continue to follow.

## 2021-04-24 NOTE — Progress Notes (Signed)
Called a CODE in response to pt being unresponsive and diaphoretic, thready pulse, sitting up in the chair. Pts nurse, Marchelle Folks was present with the pt when this happened. Transferred pt to the bed and got the code board underneath her. Plenty of staff were present and pads were placed and did one compression and the pt became responsive. CBG done and was 120. Code team present. Cardiac monitor showed NSR rate 73 at this time. Code was stopped and called downstairs to let them know. Dr. Jerral Ralph came to room and orders received. Pt able to state her name  and alert. Rapid Response, Luisa Hart and SWOT nurse, Juliette Alcide in the room. Started IV Bolus, showed Dr. Jerral Ralph the BM she had just had (dark red). Order for transfer to [facility]. EKG completed. Dr. Jerral Ralph called husband. Chaplain here and spoke to the pt. Pt alert and oriented and will transfer to [facility] 01.

## 2021-04-24 NOTE — Progress Notes (Addendum)
[facility] Progress Note  CC:  Bleeding  Assessment / Plan: Sigmoid diverticular bleeding s/p embolization 04/23/21 Hemoglobin stable following the procedure Bright red blood seen by the patient this morning  No evidence for active bleeding except for a small amount this morning. Suspect the blood is old, but, the patient is extremely concerned about the bright color and anxious to go home in the event that she is still bleeding.  Plan: - Advance diet as tolerated - Continue serial hgb/hct with transfusion if indicated while hospitalized - High fiber diet with use of psyllium or methycellulose on discharge - She is anxious to go home today given the blood this morning, additional overnight observation recommended - Outpatient colonoscopy with her gastroenterologist in [place]  No additional inpatient GI evaluation planned at this time. Please call the on-call gastroenterologist with any additional questions or concerns during this admission.   Subjective: Spotting of red blood today. Otherwise, no GI complaints. Eating clear liquids at the time of my evaluation.  Husband present at the bedside.   Objective:  Vital signs in last 24 hours: Temp:  [97.5 F (36.4 C)-98.9 F (37.2 C)] 98.9 F (37.2 C) (11/27 0925) Pulse Rate:  [68-85] 74 (11/27 0925) Resp:  [10-21] 18 (11/27 0925) BP: (100-135)/(68-84) 122/77 (11/27 0925) SpO2:  [93 %-100 %] 94 % (11/27 0925) Weight:  [78.8 kg] 78.8 kg (11/27 0500) Last BM Date: 04/23/21 General:   Alert, in NAD Abdomen:  Soft. Nontender. Nondistended. Normal bowel sounds. No rebound or guarding. Neurologic:  Alert and  oriented x4;  grossly normal neurologically. Psych:  Alert and cooperative. Normal mood and affect.  Intake/Output from previous day: 11/26 0701 - 11/27 0700 In: 1595.8 [P.O.:600; I.V.:995.8] Out: 250 [Urine:250] Intake/Output this shift: No intake/output data recorded.  Lab Results: Recent Labs     04/23/21 1623 04/24/21 0235 04/24/21 0740  WBC 11.3* 10.5 9.6  HGB 11.7* 10.3* 10.4*  HCT 35.8* 32.4* 32.7*  PLT 219 205 178   BMET Recent Labs    04/23/21 0441 04/24/21 0235 04/24/21 0740  NA 137 137 136  K 3.4* 3.8 4.0  CL 102 108 107  CO2 $Re'25 24 22  'vtx$ GLUCOSE 127* 110* 103*  BUN $Re'16 15 18  'UiC$ CREATININE 0.52 0.50 0.59  CALCIUM 8.2* 8.1* 8.1*   LFT Recent Labs    04/24/21 0740  PROT 5.3*  ALBUMIN 2.9*  AST 15  ALT 12  ALKPHOS 59  BILITOT 0.6   PT/INR Recent Labs    04/23/21 0441  LABPROT 13.6  INR 1.0   Hepatitis Panel No results for input(s): HEPBSAG, HCVAB, HEPAIGM, HEPBIGM in the last 72 hours.  IR Angiogram Visceral Selective  Result Date: 04/24/2021 INDICATION: Acute lower GI bleeding. Positive CTA for active diverticular bleeding within the distal descending/proximal sigmoid colon. Please perform mesenteric arteriogram and percutaneous embolization as indicated. EXAM: 1. ULTRASOUND GUIDANCE FOR ARTERIAL ACCESS 2. SELECTIVE SUPERIOR MESENTERIC ARTERIOGRAM 3. FLUSH AORTOGRAM 4. SELECTIVE INFERIOR MESENTERIC ARTERIOGRAM 5. COMPARISON:  CTA abdomen and pelvis - earlier same day MEDICATIONS: Bowers ANESTHESIA/SEDATION: Moderate (conscious) sedation was employed during this procedure as administered by the Interventional Radiology RN. A total of Versed 4 mg and Fentanyl 100 mcg was administered intravenously. Moderate Sedation Time: 60 minutes. The patient's level of consciousness and vital signs were monitored continuously by radiology nursing throughout the procedure under my direct supervision. CONTRAST:  60 cc Omnipaque 300 FLUOROSCOPY TIME:  19.6 minutes (680 mGy) COMPLICATIONS: Bowers immediate. PROCEDURE: Informed consent was  obtained from the patient following explanation of the procedure, risks, benefits and alternatives. All questions were addressed. A time out was performed prior to the initiation of the procedure. Maximal barrier sterile technique utilized including  caps, mask, sterile gowns, sterile gloves, large sterile drape, hand hygiene, and Betadine prep. The right femoral head was marked fluoroscopically. Under sterile conditions and local anesthesia, the right common femoral artery access was performed with a micropuncture needle. Under direct ultrasound guidance, the right common femoral was accessed with a micropuncture kit. An ultrasound image was saved for documentation purposes. This allowed for placement of a 5-French vascular sheath. A limited arteriogram was performed through the side arm of the sheath confirming appropriate access within the right common femoral artery. Over a Bentson wire, a Mickelson catheter was advanced the caudal aspect of the thoracic aorta where was reformed, back bled and flushed. The Mickelson catheter was then utilized to select the superior mesenteric artery and a selective superior mesenteric arteriogram was performed. Next, the Mickelson catheter was utilized to attempt to cannulate the inferior mesenteric artery however this proved challenging given tortuosity of the abdominal aorta. As such, over Bentson wire, the Mickelson catheter was exchanged for an Omni Flush catheter and a flush abdominal aortogram was performed demarcating the origin of the IMA. Next, with the use of a 5 French angled glide catheter, the IMA was selected and a selective inferior mesenteric arteriogram was performed. With the use of a fathom 14 microwire, a regular Renegade microcatheter was advanced to the level of the trifurcation of the IMA and a sub selective inferior mesenteric arteriogram was performed. The microcatheter was then advanced to select the sigmoidal artery and a selective sigmoidal arteriogram was performed. The microcatheter was advanced into the distal arcade of the sigmoidal artery, beyond the location of the distal tributaries supplying the ill-defined area of contrast extravasation within the distal descending/proximal sigmoid colon.  Contrast injection confirmed appropriate positioning and the short-segment of the distal arcade was percutaneously coil embolized with overlapping 2 mm diameter interlock coils. The microcatheter was retracted to the level of the sigmoidal artery and a post embolization sigmoidal arteriogram was performed. The microcatheter was then utilized to select the left colic artery and a selective left colic arteriogram was performed Images were reviewed and the procedure was terminated. All wires, catheters and sheaths were removed from the patient. Hemostasis was achieved at the right groin access site with manual compression. The patient tolerated the procedure well without immediate post procedural complication. FINDINGS: Selective inferior mesenteric arteriogram demonstrates a conventional branching pattern and is negative for significant arterial supply to the distal descending/proximal sigmoid colon. No discrete areas of vessel irregularity or contrast extravasation are identified. Flush abdominal aortogram demonstrates patency of the IMA. The abdominal aorta is noted to be tortuous but of normal caliber as are the bilateral common and internal iliac arteries. Selective inferior mesenteric arteriogram demonstrates a conventional branching pattern. A ill-defined area of contrast extravasation is seen at the level of the distal descending/proximal sigmoid colon, correlating with the findings on preceding CTA. Dominant arterial supply to this area of contrast extravasation is via the sigmoidal artery. Sub selective sigmoidal arteriogram confirms an ill-defined area of contrast extravasation supplied via a tiny tertiary branch from the distal arcade of the sigmoidal artery. The distal arcade was percutaneously coil embolized across the origin of the tiny tertiary branch supplying the ill-defined area of contrast extravasation. Post embolization selective arteriograms performed both from the level of the sigmoidal and left  colic arteries are negative for persistent active extravasation. IMPRESSION: Technically successful percutaneous coil embolization of a distal arcade of the sigmoidal artery across the origin of a tiny tertiary branch supplying an ill-defined area of contrast extravasation at the level of the distal descending/proximal sigmoid colon, correlating with the findings seen on preceding CTA. PLAN: - The patient is to remain flat for 4 hours with right leg straight. - The patient will continue to experience several additional bloody bowel movements and may continue to require additional resuscitation (as she was bleeding both before and during the procedure), however ultimately I am hopeful she will stabilize in the coming days. - While presumably secondary to diverticular disease, repeat colonoscopy after the resolution of acute symptoms is advised to exclude the presence of an underlying mass/lesion. Electronically Signed   By: Sandi Mariscal M.D.   On: 04/24/2021 08:53   IR Angiogram Visceral Selective  Result Date: 04/24/2021 INDICATION: Acute lower GI bleeding. Positive CTA for active diverticular bleeding within the distal descending/proximal sigmoid colon. Please perform mesenteric arteriogram and percutaneous embolization as indicated. EXAM: 1. ULTRASOUND GUIDANCE FOR ARTERIAL ACCESS 2. SELECTIVE SUPERIOR MESENTERIC ARTERIOGRAM 3. FLUSH AORTOGRAM 4. SELECTIVE INFERIOR MESENTERIC ARTERIOGRAM 5. COMPARISON:  CTA abdomen and pelvis - earlier same day MEDICATIONS: Bowers ANESTHESIA/SEDATION: Moderate (conscious) sedation was employed during this procedure as administered by the Interventional Radiology RN. A total of Versed 4 mg and Fentanyl 100 mcg was administered intravenously. Moderate Sedation Time: 60 minutes. The patient's level of consciousness and vital signs were monitored continuously by radiology nursing throughout the procedure under my direct supervision. CONTRAST:  60 cc Omnipaque 300 FLUOROSCOPY TIME:   19.6 minutes (878 mGy) COMPLICATIONS: Bowers immediate. PROCEDURE: Informed consent was obtained from the patient following explanation of the procedure, risks, benefits and alternatives. All questions were addressed. A time out was performed prior to the initiation of the procedure. Maximal barrier sterile technique utilized including caps, mask, sterile gowns, sterile gloves, large sterile drape, hand hygiene, and Betadine prep. The right femoral head was marked fluoroscopically. Under sterile conditions and local anesthesia, the right common femoral artery access was performed with a micropuncture needle. Under direct ultrasound guidance, the right common femoral was accessed with a micropuncture kit. An ultrasound image was saved for documentation purposes. This allowed for placement of a 5-French vascular sheath. A limited arteriogram was performed through the side arm of the sheath confirming appropriate access within the right common femoral artery. Over a Bentson wire, a Mickelson catheter was advanced the caudal aspect of the thoracic aorta where was reformed, back bled and flushed. The Mickelson catheter was then utilized to select the superior mesenteric artery and a selective superior mesenteric arteriogram was performed. Next, the Mickelson catheter was utilized to attempt to cannulate the inferior mesenteric artery however this proved challenging given tortuosity of the abdominal aorta. As such, over Bentson wire, the Mickelson catheter was exchanged for an Omni Flush catheter and a flush abdominal aortogram was performed demarcating the origin of the IMA. Next, with the use of a 5 French angled glide catheter, the IMA was selected and a selective inferior mesenteric arteriogram was performed. With the use of a fathom 14 microwire, a regular Renegade microcatheter was advanced to the level of the trifurcation of the IMA and a sub selective inferior mesenteric arteriogram was performed. The microcatheter  was then advanced to select the sigmoidal artery and a selective sigmoidal arteriogram was performed. The microcatheter was advanced into the distal arcade of the sigmoidal artery,  beyond the location of the distal tributaries supplying the ill-defined area of contrast extravasation within the distal descending/proximal sigmoid colon. Contrast injection confirmed appropriate positioning and the short-segment of the distal arcade was percutaneously coil embolized with overlapping 2 mm diameter interlock coils. The microcatheter was retracted to the level of the sigmoidal artery and a post embolization sigmoidal arteriogram was performed. The microcatheter was then utilized to select the left colic artery and a selective left colic arteriogram was performed Images were reviewed and the procedure was terminated. All wires, catheters and sheaths were removed from the patient. Hemostasis was achieved at the right groin access site with manual compression. The patient tolerated the procedure well without immediate post procedural complication. FINDINGS: Selective inferior mesenteric arteriogram demonstrates a conventional branching pattern and is negative for significant arterial supply to the distal descending/proximal sigmoid colon. No discrete areas of vessel irregularity or contrast extravasation are identified. Flush abdominal aortogram demonstrates patency of the IMA. The abdominal aorta is noted to be tortuous but of normal caliber as are the bilateral common and internal iliac arteries. Selective inferior mesenteric arteriogram demonstrates a conventional branching pattern. A ill-defined area of contrast extravasation is seen at the level of the distal descending/proximal sigmoid colon, correlating with the findings on preceding CTA. Dominant arterial supply to this area of contrast extravasation is via the sigmoidal artery. Sub selective sigmoidal arteriogram confirms an ill-defined area of contrast extravasation  supplied via a tiny tertiary branch from the distal arcade of the sigmoidal artery. The distal arcade was percutaneously coil embolized across the origin of the tiny tertiary branch supplying the ill-defined area of contrast extravasation. Post embolization selective arteriograms performed both from the level of the sigmoidal and left colic arteries are negative for persistent active extravasation. IMPRESSION: Technically successful percutaneous coil embolization of a distal arcade of the sigmoidal artery across the origin of a tiny tertiary branch supplying an ill-defined area of contrast extravasation at the level of the distal descending/proximal sigmoid colon, correlating with the findings seen on preceding CTA. PLAN: - The patient is to remain flat for 4 hours with right leg straight. - The patient will continue to experience several additional bloody bowel movements and may continue to require additional resuscitation (as she was bleeding both before and during the procedure), however ultimately I am hopeful she will stabilize in the coming days. - While presumably secondary to diverticular disease, repeat colonoscopy after the resolution of acute symptoms is advised to exclude the presence of an underlying mass/lesion. Electronically Signed   By: Sandi Mariscal M.D.   On: 04/24/2021 08:53   IR Angiogram Selective Each Additional Vessel  Result Date: 04/24/2021 INDICATION: Acute lower GI bleeding. Positive CTA for active diverticular bleeding within the distal descending/proximal sigmoid colon. Please perform mesenteric arteriogram and percutaneous embolization as indicated. EXAM: 1. ULTRASOUND GUIDANCE FOR ARTERIAL ACCESS 2. SELECTIVE SUPERIOR MESENTERIC ARTERIOGRAM 3. FLUSH AORTOGRAM 4. SELECTIVE INFERIOR MESENTERIC ARTERIOGRAM 5. COMPARISON:  CTA abdomen and pelvis - earlier same day MEDICATIONS: Bowers ANESTHESIA/SEDATION: Moderate (conscious) sedation was employed during this procedure as administered by  the Interventional Radiology RN. A total of Versed 4 mg and Fentanyl 100 mcg was administered intravenously. Moderate Sedation Time: 60 minutes. The patient's level of consciousness and vital signs were monitored continuously by radiology nursing throughout the procedure under my direct supervision. CONTRAST:  60 cc Omnipaque 300 FLUOROSCOPY TIME:  19.6 minutes (419 mGy) COMPLICATIONS: Bowers immediate. PROCEDURE: Informed consent was obtained from the patient following explanation of the procedure, risks,  benefits and alternatives. All questions were addressed. A time out was performed prior to the initiation of the procedure. Maximal barrier sterile technique utilized including caps, mask, sterile gowns, sterile gloves, large sterile drape, hand hygiene, and Betadine prep. The right femoral head was marked fluoroscopically. Under sterile conditions and local anesthesia, the right common femoral artery access was performed with a micropuncture needle. Under direct ultrasound guidance, the right common femoral was accessed with a micropuncture kit. An ultrasound image was saved for documentation purposes. This allowed for placement of a 5-French vascular sheath. A limited arteriogram was performed through the side arm of the sheath confirming appropriate access within the right common femoral artery. Over a Bentson wire, a Mickelson catheter was advanced the caudal aspect of the thoracic aorta where was reformed, back bled and flushed. The Mickelson catheter was then utilized to select the superior mesenteric artery and a selective superior mesenteric arteriogram was performed. Next, the Mickelson catheter was utilized to attempt to cannulate the inferior mesenteric artery however this proved challenging given tortuosity of the abdominal aorta. As such, over Bentson wire, the Mickelson catheter was exchanged for an Omni Flush catheter and a flush abdominal aortogram was performed demarcating the origin of the IMA.  Next, with the use of a 5 French angled glide catheter, the IMA was selected and a selective inferior mesenteric arteriogram was performed. With the use of a fathom 14 microwire, a regular Renegade microcatheter was advanced to the level of the trifurcation of the IMA and a sub selective inferior mesenteric arteriogram was performed. The microcatheter was then advanced to select the sigmoidal artery and a selective sigmoidal arteriogram was performed. The microcatheter was advanced into the distal arcade of the sigmoidal artery, beyond the location of the distal tributaries supplying the ill-defined area of contrast extravasation within the distal descending/proximal sigmoid colon. Contrast injection confirmed appropriate positioning and the short-segment of the distal arcade was percutaneously coil embolized with overlapping 2 mm diameter interlock coils. The microcatheter was retracted to the level of the sigmoidal artery and a post embolization sigmoidal arteriogram was performed. The microcatheter was then utilized to select the left colic artery and a selective left colic arteriogram was performed Images were reviewed and the procedure was terminated. All wires, catheters and sheaths were removed from the patient. Hemostasis was achieved at the right groin access site with manual compression. The patient tolerated the procedure well without immediate post procedural complication. FINDINGS: Selective inferior mesenteric arteriogram demonstrates a conventional branching pattern and is negative for significant arterial supply to the distal descending/proximal sigmoid colon. No discrete areas of vessel irregularity or contrast extravasation are identified. Flush abdominal aortogram demonstrates patency of the IMA. The abdominal aorta is noted to be tortuous but of normal caliber as are the bilateral common and internal iliac arteries. Selective inferior mesenteric arteriogram demonstrates a conventional branching  pattern. A ill-defined area of contrast extravasation is seen at the level of the distal descending/proximal sigmoid colon, correlating with the findings on preceding CTA. Dominant arterial supply to this area of contrast extravasation is via the sigmoidal artery. Sub selective sigmoidal arteriogram confirms an ill-defined area of contrast extravasation supplied via a tiny tertiary branch from the distal arcade of the sigmoidal artery. The distal arcade was percutaneously coil embolized across the origin of the tiny tertiary branch supplying the ill-defined area of contrast extravasation. Post embolization selective arteriograms performed both from the level of the sigmoidal and left colic arteries are negative for persistent active extravasation. IMPRESSION: Technically  successful percutaneous coil embolization of a distal arcade of the sigmoidal artery across the origin of a tiny tertiary branch supplying an ill-defined area of contrast extravasation at the level of the distal descending/proximal sigmoid colon, correlating with the findings seen on preceding CTA. PLAN: - The patient is to remain flat for 4 hours with right leg straight. - The patient will continue to experience several additional bloody bowel movements and may continue to require additional resuscitation (as she was bleeding both before and during the procedure), however ultimately I am hopeful she will stabilize in the coming days. - While presumably secondary to diverticular disease, repeat colonoscopy after the resolution of acute symptoms is advised to exclude the presence of an underlying mass/lesion. Electronically Signed   By: Sandi Mariscal M.D.   On: 04/24/2021 08:53   IR Angiogram Selective Each Additional Vessel  Result Date: 04/24/2021 INDICATION: Acute lower GI bleeding. Positive CTA for active diverticular bleeding within the distal descending/proximal sigmoid colon. Please perform mesenteric arteriogram and percutaneous  embolization as indicated. EXAM: 1. ULTRASOUND GUIDANCE FOR ARTERIAL ACCESS 2. SELECTIVE SUPERIOR MESENTERIC ARTERIOGRAM 3. FLUSH AORTOGRAM 4. SELECTIVE INFERIOR MESENTERIC ARTERIOGRAM 5. COMPARISON:  CTA abdomen and pelvis - earlier same day MEDICATIONS: Bowers ANESTHESIA/SEDATION: Moderate (conscious) sedation was employed during this procedure as administered by the Interventional Radiology RN. A total of Versed 4 mg and Fentanyl 100 mcg was administered intravenously. Moderate Sedation Time: 60 minutes. The patient's level of consciousness and vital signs were monitored continuously by radiology nursing throughout the procedure under my direct supervision. CONTRAST:  60 cc Omnipaque 300 FLUOROSCOPY TIME:  19.6 minutes (654 mGy) COMPLICATIONS: Bowers immediate. PROCEDURE: Informed consent was obtained from the patient following explanation of the procedure, risks, benefits and alternatives. All questions were addressed. A time out was performed prior to the initiation of the procedure. Maximal barrier sterile technique utilized including caps, mask, sterile gowns, sterile gloves, large sterile drape, hand hygiene, and Betadine prep. The right femoral head was marked fluoroscopically. Under sterile conditions and local anesthesia, the right common femoral artery access was performed with a micropuncture needle. Under direct ultrasound guidance, the right common femoral was accessed with a micropuncture kit. An ultrasound image was saved for documentation purposes. This allowed for placement of a 5-French vascular sheath. A limited arteriogram was performed through the side arm of the sheath confirming appropriate access within the right common femoral artery. Over a Bentson wire, a Mickelson catheter was advanced the caudal aspect of the thoracic aorta where was reformed, back bled and flushed. The Mickelson catheter was then utilized to select the superior mesenteric artery and a selective superior mesenteric  arteriogram was performed. Next, the Mickelson catheter was utilized to attempt to cannulate the inferior mesenteric artery however this proved challenging given tortuosity of the abdominal aorta. As such, over Bentson wire, the Mickelson catheter was exchanged for an Omni Flush catheter and a flush abdominal aortogram was performed demarcating the origin of the IMA. Next, with the use of a 5 French angled glide catheter, the IMA was selected and a selective inferior mesenteric arteriogram was performed. With the use of a fathom 14 microwire, a regular Renegade microcatheter was advanced to the level of the trifurcation of the IMA and a sub selective inferior mesenteric arteriogram was performed. The microcatheter was then advanced to select the sigmoidal artery and a selective sigmoidal arteriogram was performed. The microcatheter was advanced into the distal arcade of the sigmoidal artery, beyond the location of the distal tributaries supplying  the ill-defined area of contrast extravasation within the distal descending/proximal sigmoid colon. Contrast injection confirmed appropriate positioning and the short-segment of the distal arcade was percutaneously coil embolized with overlapping 2 mm diameter interlock coils. The microcatheter was retracted to the level of the sigmoidal artery and a post embolization sigmoidal arteriogram was performed. The microcatheter was then utilized to select the left colic artery and a selective left colic arteriogram was performed Images were reviewed and the procedure was terminated. All wires, catheters and sheaths were removed from the patient. Hemostasis was achieved at the right groin access site with manual compression. The patient tolerated the procedure well without immediate post procedural complication. FINDINGS: Selective inferior mesenteric arteriogram demonstrates a conventional branching pattern and is negative for significant arterial supply to the distal  descending/proximal sigmoid colon. No discrete areas of vessel irregularity or contrast extravasation are identified. Flush abdominal aortogram demonstrates patency of the IMA. The abdominal aorta is noted to be tortuous but of normal caliber as are the bilateral common and internal iliac arteries. Selective inferior mesenteric arteriogram demonstrates a conventional branching pattern. A ill-defined area of contrast extravasation is seen at the level of the distal descending/proximal sigmoid colon, correlating with the findings on preceding CTA. Dominant arterial supply to this area of contrast extravasation is via the sigmoidal artery. Sub selective sigmoidal arteriogram confirms an ill-defined area of contrast extravasation supplied via a tiny tertiary branch from the distal arcade of the sigmoidal artery. The distal arcade was percutaneously coil embolized across the origin of the tiny tertiary branch supplying the ill-defined area of contrast extravasation. Post embolization selective arteriograms performed both from the level of the sigmoidal and left colic arteries are negative for persistent active extravasation. IMPRESSION: Technically successful percutaneous coil embolization of a distal arcade of the sigmoidal artery across the origin of a tiny tertiary branch supplying an ill-defined area of contrast extravasation at the level of the distal descending/proximal sigmoid colon, correlating with the findings seen on preceding CTA. PLAN: - The patient is to remain flat for 4 hours with right leg straight. - The patient will continue to experience several additional bloody bowel movements and may continue to require additional resuscitation (as she was bleeding both before and during the procedure), however ultimately I am hopeful she will stabilize in the coming days. - While presumably secondary to diverticular disease, repeat colonoscopy after the resolution of acute symptoms is advised to exclude the  presence of an underlying mass/lesion. Electronically Signed   By: Sandi Mariscal M.D.   On: 04/24/2021 08:53   IR US Guide Vasc Access Right  Result Date: 04/24/2021 INDICATION: Acute lower GI bleeding. Positive CTA for active diverticular bleeding within the distal descending/proximal sigmoid colon. Please perform mesenteric arteriogram and percutaneous embolization as indicated. EXAM: 1. ULTRASOUND GUIDANCE FOR ARTERIAL ACCESS 2. SELECTIVE SUPERIOR MESENTERIC ARTERIOGRAM 3. FLUSH AORTOGRAM 4. SELECTIVE INFERIOR MESENTERIC ARTERIOGRAM 5. COMPARISON:  CTA abdomen and pelvis - earlier same day MEDICATIONS: Bowers ANESTHESIA/SEDATION: Moderate (conscious) sedation was employed during this procedure as administered by the Interventional Radiology RN. A total of Versed 4 mg and Fentanyl 100 mcg was administered intravenously. Moderate Sedation Time: 60 minutes. The patient's level of consciousness and vital signs were monitored continuously by radiology nursing throughout the procedure under my direct supervision. CONTRAST:  60 cc Omnipaque 300 FLUOROSCOPY TIME:  19.6 minutes (244 mGy) COMPLICATIONS: Bowers immediate. PROCEDURE: Informed consent was obtained from the patient following explanation of the procedure, risks, benefits and alternatives. All questions were addressed. A  time out was performed prior to the initiation of the procedure. Maximal barrier sterile technique utilized including caps, mask, sterile gowns, sterile gloves, large sterile drape, hand hygiene, and Betadine prep. The right femoral head was marked fluoroscopically. Under sterile conditions and local anesthesia, the right common femoral artery access was performed with a micropuncture needle. Under direct ultrasound guidance, the right common femoral was accessed with a micropuncture kit. An ultrasound image was saved for documentation purposes. This allowed for placement of a 5-French vascular sheath. A limited arteriogram was performed through  the side arm of the sheath confirming appropriate access within the right common femoral artery. Over a Bentson wire, a Mickelson catheter was advanced the caudal aspect of the thoracic aorta where was reformed, back bled and flushed. The Mickelson catheter was then utilized to select the superior mesenteric artery and a selective superior mesenteric arteriogram was performed. Next, the Mickelson catheter was utilized to attempt to cannulate the inferior mesenteric artery however this proved challenging given tortuosity of the abdominal aorta. As such, over Bentson wire, the Mickelson catheter was exchanged for an Omni Flush catheter and a flush abdominal aortogram was performed demarcating the origin of the IMA. Next, with the use of a 5 French angled glide catheter, the IMA was selected and a selective inferior mesenteric arteriogram was performed. With the use of a fathom 14 microwire, a regular Renegade microcatheter was advanced to the level of the trifurcation of the IMA and a sub selective inferior mesenteric arteriogram was performed. The microcatheter was then advanced to select the sigmoidal artery and a selective sigmoidal arteriogram was performed. The microcatheter was advanced into the distal arcade of the sigmoidal artery, beyond the location of the distal tributaries supplying the ill-defined area of contrast extravasation within the distal descending/proximal sigmoid colon. Contrast injection confirmed appropriate positioning and the short-segment of the distal arcade was percutaneously coil embolized with overlapping 2 mm diameter interlock coils. The microcatheter was retracted to the level of the sigmoidal artery and a post embolization sigmoidal arteriogram was performed. The microcatheter was then utilized to select the left colic artery and a selective left colic arteriogram was performed Images were reviewed and the procedure was terminated. All wires, catheters and sheaths were removed from  the patient. Hemostasis was achieved at the right groin access site with manual compression. The patient tolerated the procedure well without immediate post procedural complication. FINDINGS: Selective inferior mesenteric arteriogram demonstrates a conventional branching pattern and is negative for significant arterial supply to the distal descending/proximal sigmoid colon. No discrete areas of vessel irregularity or contrast extravasation are identified. Flush abdominal aortogram demonstrates patency of the IMA. The abdominal aorta is noted to be tortuous but of normal caliber as are the bilateral common and internal iliac arteries. Selective inferior mesenteric arteriogram demonstrates a conventional branching pattern. A ill-defined area of contrast extravasation is seen at the level of the distal descending/proximal sigmoid colon, correlating with the findings on preceding CTA. Dominant arterial supply to this area of contrast extravasation is via the sigmoidal artery. Sub selective sigmoidal arteriogram confirms an ill-defined area of contrast extravasation supplied via a tiny tertiary branch from the distal arcade of the sigmoidal artery. The distal arcade was percutaneously coil embolized across the origin of the tiny tertiary branch supplying the ill-defined area of contrast extravasation. Post embolization selective arteriograms performed both from the level of the sigmoidal and left colic arteries are negative for persistent active extravasation. IMPRESSION: Technically successful percutaneous coil embolization of a distal arcade  of the sigmoidal artery across the origin of a tiny tertiary branch supplying an ill-defined area of contrast extravasation at the level of the distal descending/proximal sigmoid colon, correlating with the findings seen on preceding CTA. PLAN: - The patient is to remain flat for 4 hours with right leg straight. - The patient will continue to experience several additional bloody  bowel movements and may continue to require additional resuscitation (as she was bleeding both before and during the procedure), however ultimately I am hopeful she will stabilize in the coming days. - While presumably secondary to diverticular disease, repeat colonoscopy after the resolution of acute symptoms is advised to exclude the presence of an underlying mass/lesion. Electronically Signed   By: Sandi Mariscal M.D.   On: 04/24/2021 08:53   IR EMBO ARTERIAL NOT HEMORR HEMANG INC GUIDE ROADMAPPING  Result Date: 04/24/2021 INDICATION: Acute lower GI bleeding. Positive CTA for active diverticular bleeding within the distal descending/proximal sigmoid colon. Please perform mesenteric arteriogram and percutaneous embolization as indicated. EXAM: 1. ULTRASOUND GUIDANCE FOR ARTERIAL ACCESS 2. SELECTIVE SUPERIOR MESENTERIC ARTERIOGRAM 3. FLUSH AORTOGRAM 4. SELECTIVE INFERIOR MESENTERIC ARTERIOGRAM 5. COMPARISON:  CTA abdomen and pelvis - earlier same day MEDICATIONS: Bowers ANESTHESIA/SEDATION: Moderate (conscious) sedation was employed during this procedure as administered by the Interventional Radiology RN. A total of Versed 4 mg and Fentanyl 100 mcg was administered intravenously. Moderate Sedation Time: 60 minutes. The patient's level of consciousness and vital signs were monitored continuously by radiology nursing throughout the procedure under my direct supervision. CONTRAST:  60 cc Omnipaque 300 FLUOROSCOPY TIME:  19.6 minutes (882 mGy) COMPLICATIONS: Bowers immediate. PROCEDURE: Informed consent was obtained from the patient following explanation of the procedure, risks, benefits and alternatives. All questions were addressed. A time out was performed prior to the initiation of the procedure. Maximal barrier sterile technique utilized including caps, mask, sterile gowns, sterile gloves, large sterile drape, hand hygiene, and Betadine prep. The right femoral head was marked fluoroscopically. Under sterile  conditions and local anesthesia, the right common femoral artery access was performed with a micropuncture needle. Under direct ultrasound guidance, the right common femoral was accessed with a micropuncture kit. An ultrasound image was saved for documentation purposes. This allowed for placement of a 5-French vascular sheath. A limited arteriogram was performed through the side arm of the sheath confirming appropriate access within the right common femoral artery. Over a Bentson wire, a Mickelson catheter was advanced the caudal aspect of the thoracic aorta where was reformed, back bled and flushed. The Mickelson catheter was then utilized to select the superior mesenteric artery and a selective superior mesenteric arteriogram was performed. Next, the Mickelson catheter was utilized to attempt to cannulate the inferior mesenteric artery however this proved challenging given tortuosity of the abdominal aorta. As such, over Bentson wire, the Mickelson catheter was exchanged for an Omni Flush catheter and a flush abdominal aortogram was performed demarcating the origin of the IMA. Next, with the use of a 5 French angled glide catheter, the IMA was selected and a selective inferior mesenteric arteriogram was performed. With the use of a fathom 14 microwire, a regular Renegade microcatheter was advanced to the level of the trifurcation of the IMA and a sub selective inferior mesenteric arteriogram was performed. The microcatheter was then advanced to select the sigmoidal artery and a selective sigmoidal arteriogram was performed. The microcatheter was advanced into the distal arcade of the sigmoidal artery, beyond the location of the distal tributaries supplying the ill-defined area of contrast  extravasation within the distal descending/proximal sigmoid colon. Contrast injection confirmed appropriate positioning and the short-segment of the distal arcade was percutaneously coil embolized with overlapping 2 mm diameter  interlock coils. The microcatheter was retracted to the level of the sigmoidal artery and a post embolization sigmoidal arteriogram was performed. The microcatheter was then utilized to select the left colic artery and a selective left colic arteriogram was performed Images were reviewed and the procedure was terminated. All wires, catheters and sheaths were removed from the patient. Hemostasis was achieved at the right groin access site with manual compression. The patient tolerated the procedure well without immediate post procedural complication. FINDINGS: Selective inferior mesenteric arteriogram demonstrates a conventional branching pattern and is negative for significant arterial supply to the distal descending/proximal sigmoid colon. No discrete areas of vessel irregularity or contrast extravasation are identified. Flush abdominal aortogram demonstrates patency of the IMA. The abdominal aorta is noted to be tortuous but of normal caliber as are the bilateral common and internal iliac arteries. Selective inferior mesenteric arteriogram demonstrates a conventional branching pattern. A ill-defined area of contrast extravasation is seen at the level of the distal descending/proximal sigmoid colon, correlating with the findings on preceding CTA. Dominant arterial supply to this area of contrast extravasation is via the sigmoidal artery. Sub selective sigmoidal arteriogram confirms an ill-defined area of contrast extravasation supplied via a tiny tertiary branch from the distal arcade of the sigmoidal artery. The distal arcade was percutaneously coil embolized across the origin of the tiny tertiary branch supplying the ill-defined area of contrast extravasation. Post embolization selective arteriograms performed both from the level of the sigmoidal and left colic arteries are negative for persistent active extravasation. IMPRESSION: Technically successful percutaneous coil embolization of a distal arcade of the  sigmoidal artery across the origin of a tiny tertiary branch supplying an ill-defined area of contrast extravasation at the level of the distal descending/proximal sigmoid colon, correlating with the findings seen on preceding CTA. PLAN: - The patient is to remain flat for 4 hours with right leg straight. - The patient will continue to experience several additional bloody bowel movements and may continue to require additional resuscitation (as she was bleeding both before and during the procedure), however ultimately I am hopeful she will stabilize in the coming days. - While presumably secondary to diverticular disease, repeat colonoscopy after the resolution of acute symptoms is advised to exclude the presence of an underlying mass/lesion. Electronically Signed   By: Sandi Mariscal M.D.   On: 04/24/2021 08:53   CT Angio Abd/Pel w/ and/or w/o  Result Date: 04/23/2021 CLINICAL DATA:  GI bleed, bright red blood per rectum EXAM: CTA ABDOMEN AND PELVIS WITHOUT AND WITH CONTRAST TECHNIQUE: Multidetector CT imaging of the abdomen and pelvis was performed using the standard protocol during bolus administration of intravenous contrast. Multiplanar reconstructed images and MIPs were obtained and reviewed to evaluate the vascular anatomy. CONTRAST:  171mL OMNIPAQUE IOHEXOL 350 MG/ML SOLN IV COMPARISON:  Bowers FINDINGS: VASCULAR Aorta: Atherosclerotic calcifications of tortuous abdominal aorta without aneurysm. No intramural hematoma on precontrast imaging. No dissection. No significant thrombus. Celiac: Mild plaque at origin.  No significant stenosis. SMA: Widely patent Renals: Plaque formation at BILATERAL renal artery origins, not significant on LEFT, estimated 50% on RIGHT. IMA: Patent origin Inflow: Atherosclerotic calcifications and tortuosity of common iliac arteries. Additional calcified plaque within internal iliac arteries bilaterally. Proximal Outflow: Mild plaque at common femoral arteries and bifurcations Veins:  Patent Review of the MIP images confirms the above  findings. NON-VASCULAR Lower chest: Lung bases clear Hepatobiliary: Cyst at central liver 2.2 x 1.7 cm series 6, image 16. Gallbladder and liver otherwise unremarkable. Pancreas: Normal appearance Spleen: Normal appearance Adrenals/Urinary Tract: Tiny BILATERAL renal cysts. Adrenal glands, kidneys, ureters, and bladder otherwise normal appearance. Stomach/Bowel: Diffuse diverticulosis of descending and sigmoid colon. Active site of IV contrast extravasation identified at the distal descending colon near the descending sigmoid junction, where a single diverticulum which is low attenuation on precontrast imaging demonstrates high attenuation postcontrast, and delayed images showing passage of extravasated blood into the colonic lumen. Findings consistent with a site of active GI bleeding. No additional sites of abnormal contrast extravasation identified. Stomach and remaining bowel loops unremarkable. Lymphatic: No adenopathy Reproductive: Unremarkable uterus and ovaries Other: No free air or free fluid.  No hernia. Musculoskeletal: Diffuse osseous demineralization. IMPRESSION: VASCULAR Scattered atherosclerotic plaque formation without evidence of critical stenosis. 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 No evidence of aneurysm or dissection. NON-VASCULAR Active site of IV contrast extravasation at a diverticulum at the distal descending colon near the descending sigmoid junction with contrast extending in to the colonic lumen on delayed images consistent with active GI bleeding. Aortic Atherosclerosis (ICD10-I70.0). Findings discussed with Dr. Tarri Glenn on 04/23/2021 at 1943 hours. Electronically Signed   By: Lavonia Dana M.D.   On: 04/23/2021 19:51      LOS: 1 day   [place]  04/24/2021, 11:42 AM

## 2021-04-25 ENCOUNTER — Inpatient Hospital Stay (HOSPITAL_COMMUNITY): Payer: Medicare Other

## 2021-04-25 DIAGNOSIS — F419 Anxiety disorder, unspecified: Secondary | ICD-10-CM

## 2021-04-25 DIAGNOSIS — R55 Syncope and collapse: Secondary | ICD-10-CM

## 2021-04-25 DIAGNOSIS — I2699 Other pulmonary embolism without acute cor pulmonale: Secondary | ICD-10-CM

## 2021-04-25 LAB — ECHOCARDIOGRAM COMPLETE
AR max vel: 2.48 cm2
AV Area VTI: 2.26 cm2
AV Area mean vel: 2.38 cm2
AV Mean grad: 8 mmHg
AV Peak grad: 15.8 mmHg
Ao pk vel: 1.99 m/s
Area-P 1/2: 2.07 cm2
Calc EF: 63.2 %
Height: 66 in
S' Lateral: 2.35 cm
Single Plane A2C EF: 63.9 %
Single Plane A4C EF: 64.2 %
Weight: 2779.56 oz

## 2021-04-25 LAB — COMPREHENSIVE METABOLIC PANEL
ALT: 15 U/L (ref 0–44)
AST: 16 U/L (ref 15–41)
Albumin: 2.9 g/dL — ABNORMAL LOW (ref 3.5–5.0)
Alkaline Phosphatase: 64 U/L (ref 38–126)
Anion gap: 7 (ref 5–15)
BUN: 13 mg/dL (ref 8–23)
CO2: 24 mmol/L (ref 22–32)
Calcium: 8.3 mg/dL — ABNORMAL LOW (ref 8.9–10.3)
Chloride: 107 mmol/L (ref 98–111)
Creatinine, Ser: 0.54 mg/dL (ref 0.44–1.00)
GFR, Estimated: >60 mL/min (ref >60)
Glucose, Bld: 104 mg/dL — ABNORMAL HIGH (ref 70–99)
Potassium: 3.3 mmol/L — ABNORMAL LOW (ref 3.5–5.1)
Sodium: 138 mmol/L (ref 135–145)
Total Bilirubin: 1 mg/dL (ref 0.3–1.2)
Total Protein: 5.3 g/dL — ABNORMAL LOW (ref 6.5–8.1)

## 2021-04-25 LAB — CBC
HCT: 29.4 % — ABNORMAL LOW (ref 36.0–46.0)
HCT: 30.5 % — ABNORMAL LOW (ref 36.0–46.0)
HCT: 29.7 % — ABNORMAL LOW (ref 36.0–46.0)
HCT: 30 % — ABNORMAL LOW (ref 36.0–46.0)
Hemoglobin: 9.5 g/dL — ABNORMAL LOW (ref 12.0–15.0)
Hemoglobin: 9.8 g/dL — ABNORMAL LOW (ref 12.0–15.0)
Hemoglobin: 9.6 g/dL — ABNORMAL LOW (ref 12.0–15.0)
Hemoglobin: 9.6 g/dL — ABNORMAL LOW (ref 12.0–15.0)
MCH: 30.1 pg (ref 26.0–34.0)
MCH: 29.9 pg (ref 26.0–34.0)
MCH: 30.1 pg (ref 26.0–34.0)
MCH: 29.5 pg (ref 26.0–34.0)
MCHC: 32.3 g/dL (ref 30.0–36.0)
MCHC: 32.1 g/dL (ref 30.0–36.0)
MCHC: 32.3 g/dL (ref 30.0–36.0)
MCHC: 32 g/dL (ref 30.0–36.0)
MCV: 93 fL (ref 80.0–100.0)
MCV: 93 fL (ref 80.0–100.0)
MCV: 93.1 fL (ref 80.0–100.0)
MCV: 92.3 fL (ref 80.0–100.0)
Platelets: 179 10*3/uL (ref 150–400)
Platelets: 189 10*3/uL (ref 150–400)
Platelets: 189 10*3/uL (ref 150–400)
Platelets: 195 10*3/uL (ref 150–400)
RBC: 3.16 MIL/uL — ABNORMAL LOW (ref 3.87–5.11)
RBC: 3.28 MIL/uL — ABNORMAL LOW (ref 3.87–5.11)
RBC: 3.19 MIL/uL — ABNORMAL LOW (ref 3.87–5.11)
RBC: 3.25 MIL/uL — ABNORMAL LOW (ref 3.87–5.11)
RDW: 13.2 % (ref 11.5–15.5)
RDW: 13.2 % (ref 11.5–15.5)
RDW: 13.2 % (ref 11.5–15.5)
RDW: 13.2 % (ref 11.5–15.5)
WBC: 10.6 10*3/uL — ABNORMAL HIGH (ref 4.0–10.5)
WBC: 10.1 10*3/uL (ref 4.0–10.5)
WBC: 11.4 10*3/uL — ABNORMAL HIGH (ref 4.0–10.5)
WBC: 11.1 10*3/uL — ABNORMAL HIGH (ref 4.0–10.5)
nRBC: 0 % (ref 0.0–0.2)
nRBC: 0 % (ref 0.0–0.2)
nRBC: 0 % (ref 0.0–0.2)
nRBC: 0 % (ref 0.0–0.2)

## 2021-04-25 LAB — HEPARIN LEVEL (UNFRACTIONATED): Heparin Unfractionated: <0.1 IU/mL — ABNORMAL LOW (ref 0.30–0.70)

## 2021-04-25 MED ORDER — ESCITALOPRAM OXALATE 10 MG PO TABS
10.0000 mg | ORAL_TABLET | Freq: Every day | ORAL | Status: DC
  Administered 2021-04-26: 10 mg via ORAL
  Filled 2021-04-25 (×2): qty 1

## 2021-04-25 MED ORDER — PANTOPRAZOLE SODIUM 40 MG PO TBEC
40.0000 mg | DELAYED_RELEASE_TABLET | Freq: Every day | ORAL | Status: DC
  Administered 2021-04-26: 40 mg via ORAL
  Filled 2021-04-25: qty 1

## 2021-04-25 MED ORDER — IOHEXOL 350 MG/ML SOLN
100.0000 mL | Freq: Once | INTRAVENOUS | Status: AC | PRN
  Administered 2021-04-25: 11:00:00 100 mL via INTRAVENOUS

## 2021-04-25 MED ORDER — POTASSIUM CHLORIDE CRYS ER 20 MEQ PO TBCR
40.0000 meq | EXTENDED_RELEASE_TABLET | Freq: Once | ORAL | Status: AC
  Administered 2021-04-25: 11:00:00 40 meq via ORAL
  Filled 2021-04-25: qty 2

## 2021-04-25 MED ORDER — HEPARIN (PORCINE) 25000 UT/250ML-% IV SOLN
1300.0000 [IU]/h | INTRAVENOUS | Status: AC
  Administered 2021-04-25: 14:00:00 1050 [IU]/h via INTRAVENOUS
  Administered 2021-04-26: 1300 [IU]/h via INTRAVENOUS
  Filled 2021-04-25 (×2): qty 250

## 2021-04-25 MED ORDER — ROSUVASTATIN CALCIUM 20 MG PO TABS
20.0000 mg | ORAL_TABLET | Freq: Every day | ORAL | Status: DC
  Administered 2021-04-25: 21:00:00 20 mg via ORAL
  Filled 2021-04-25: qty 1

## 2021-04-25 MED ORDER — PSYLLIUM 95 % PO PACK
1.0000 | PACK | Freq: Every day | ORAL | Status: DC
  Administered 2021-04-25 – 2021-04-26 (×2): 1 via ORAL
  Filled 2021-04-25 (×2): qty 1

## 2021-04-25 NOTE — Progress Notes (Signed)
Supervising Physician: Michaelle Birks  Patient Status:  [facility] - In-pt  Chief Complaint:  Diverticular bleed  Subjective:  2 days status post embolization of sigmoidal diverticular artery with Dr. Pascal Lux.  Had expected discharge today but had syncopal episode and is now moved to 5W. She is having no belly pain, has advanced from liquids to solids today and is tolerating.  Had passed some clotted blood through rectum but no bowel movement as of yet.   She has been found to have a PE of the RLL and r pulmonary nodule during scans and has been started on IV heparin gtt.  Family at bedside.  Pleasant disposition.  No complaints.   Allergies: Patient has no known allergies.  Medications: Prior to Admission medications   Medication Sig Start Date End Date Taking? Authorizing Provider  ALPRAZolam Duanne Moron) 0.25 MG tablet Take 0.25 mg by mouth at bedtime as needed for sleep. 02/09/21  Yes [provider]  aspirin 81 MG EC tablet Take 81 mg by mouth daily.   Yes [provider]  dexlansoprazole (DEXILANT) 60 MG capsule Take 1 capsule by mouth daily. 03/08/21  Yes [provider]  escitalopram (LEXAPRO) 10 MG tablet Take 10 mg by mouth daily. 03/12/21  Yes [provider]  hydrochlorothiazide (MICROZIDE) 12.5 MG capsule Take 12.5 mg by mouth daily. 02/25/21  Yes [provider]  losartan (COZAAR) 100 MG tablet Take 100 mg by mouth daily. 02/27/21  Yes [provider]  Multiple Vitamin (MULTIVITAMIN) tablet Take 1 tablet by mouth every other day.   Yes [provider]  rosuvastatin (CRESTOR) 20 MG tablet Take 20 mg by mouth at bedtime. 05/01/20  Yes [provider]     Vital Signs: BP 138/84   Pulse 80   Temp 100 F (37.8 C) (Oral)   Resp 18   Ht _0  (1.676 m)   Wt 173 lb 11.6 oz (78.8 kg)   SpO2 97%   BMI 28.04 kg/m   Physical Exam Vitals reviewed.  HENT:     Head: Normocephalic and atraumatic.     Nose: Nose  normal.     Mouth/Throat:     Mouth: Mucous membranes are moist.     Pharynx: Oropharynx is clear.  Eyes:     Extraocular Movements: Extraocular movements intact.  Cardiovascular:     Rate and Rhythm: Normal rate and regular rhythm.  Pulmonary:     Effort: Pulmonary effort is normal.     Breath sounds: Normal breath sounds.  Abdominal:     General: Abdomen is flat.     Palpations: Abdomen is soft.  Musculoskeletal:     Cervical back: Neck supple.  Skin:    Capillary Refill: Capillary refill takes 2 to 3 seconds.     Comments: Cool, dry  Neurological:     General: No focal deficit present.     Mental Status: She is alert and oriented to person, place, and time.  Psychiatric:        Mood and Affect: Mood normal.        Thought Content: Thought content normal.    Imaging: DG Ribs Unilateral Left  Result Date: 04/24/2021 CLINICAL DATA:  CPR, rib pain. EXAM: LEFT RIBS - 2 VIEW COMPARISON:  None. FINDINGS: The lungs are clear. There is no pleural effusion or pneumothorax. The cardiomediastinal silhouette is within normal limits. There are acute nondisplaced left anterior fifth, sixth and seventh rib fractures. No displaced rib fractures are identified. IMPRESSION:  1. Acute nondisplaced left fifth, sixth and seventh rib fractures. 2. No other acute cardiopulmonary process. Electronically Signed   By: Ronney Asters M.D.   On: 04/24/2021 21:16   DG Ribs Unilateral Right  Result Date: 04/24/2021 CLINICAL DATA:  Pt complains of bilateral rib pain. Pt states she received a single compression from CPR and that is when the pain started. No other injury noted. EXAM: RIGHT RIBS - 2 VIEW COMPARISON:  None. FINDINGS: No acute displaced fracture or other bone lesions are seen involving the right ribs. IMPRESSION: No acute displaced rib fracture. Please note, nondisplaced rib fractures may be occult on radiograph. Electronically Signed   By: Iven Finn M.D.   On: 04/24/2021 22:20   CT Angio  Chest Pulmonary Embolism (PE) W or WO Contrast  Result Date: 04/25/2021 CLINICAL DATA:  PE suspected.  Low to intermediate probability. EXAM: CT ANGIOGRAPHY CHEST WITH CONTRAST TECHNIQUE: Multidetector CT imaging of the chest was performed using the standard protocol during bolus administration of intravenous contrast. Multiplanar CT image reconstructions and MIPs were obtained to evaluate the vascular anatomy. CONTRAST:  120mL OMNIPAQUE IOHEXOL 350 MG/ML SOLN COMPARISON:  Plain films of the ribs of 1 day prior. FINDINGS: Cardiovascular: The quality of this exam for evaluation of pulmonary embolism is sufficient. The bolus is suboptimally timed and there is minimal motion degradation. Right lower lobe segmental, nearly occlusive thrombus including on 309/8 and 92/9. Aortic atherosclerosis. Tortuous thoracic aorta. Normal heart size, without pericardial effusion. Lad coronary artery calcification. Mediastinum/Nodes: No mediastinal or hilar adenopathy. Contrast air level in the esophagus on 93/6. Lungs/Pleura: Trace right pleural fluid. 3 mm nodule on the right minor fissure on 78/7. 3-4 mm right middle lobe pulmonary nodule on 99/7. Upper Abdomen: Hepatic right hepatic lobe cysts. Normal imaged portions of the spleen, stomach, left adrenal gland. Musculoskeletal: No acute osseous abnormality. Review of the MIP images confirms the above findings. IMPRESSION: 1. Isolated right lower lobe segmental pulmonary embolism. 2. Trace right pleural fluid. 3. Coronary artery atherosclerosis. Aortic Atherosclerosis (ICD10-I70.0). 4. Esophageal air fluid level suggests dysmotility or gastroesophageal reflux. 5. Right-sided pulmonary nodules of up to 4 mm. No follow-up needed if patient is low-risk. Non-contrast chest CT can be considered in 12 months if patient is high-risk. This recommendation follows the consensus statement: Guidelines for Management of Incidental Pulmonary Nodules Detected on CT Images: From the Fleischner  Society 2017; Radiology 2017; 284:228-243. 1. These results will be called to the ordering clinician or representative by the Radiologist Assistant, and communication documented in the PACS or [employer]. Electronically Signed   By: Abigail Miyamoto M.D.   On: 04/25/2021 11:37   IR Angiogram Visceral Selective  Result Date: 04/24/2021 INDICATION: Acute lower GI bleeding. Positive CTA for active diverticular bleeding within the distal descending/proximal sigmoid colon. Please perform mesenteric arteriogram and percutaneous embolization as indicated. EXAM: 1. ULTRASOUND GUIDANCE FOR ARTERIAL ACCESS 2. SELECTIVE SUPERIOR MESENTERIC ARTERIOGRAM 3. FLUSH AORTOGRAM 4. SELECTIVE INFERIOR MESENTERIC ARTERIOGRAM 5. COMPARISON:  CTA abdomen and pelvis - earlier same day MEDICATIONS: None ANESTHESIA/SEDATION: Moderate (conscious) sedation was employed during this procedure as administered by the Interventional Radiology RN. A total of Versed 4 mg and Fentanyl 100 mcg was administered intravenously. Moderate Sedation Time: 60 minutes. The patient's level of consciousness and vital signs were monitored continuously by radiology nursing throughout the procedure under my direct supervision. CONTRAST:  60 cc Omnipaque 300 FLUOROSCOPY TIME:  19.6 minutes (545 mGy) COMPLICATIONS: None immediate. PROCEDURE: Informed consent was obtained from the  patient following explanation of the procedure, risks, benefits and alternatives. All questions were addressed. A time out was performed prior to the initiation of the procedure. Maximal barrier sterile technique utilized including caps, mask, sterile gowns, sterile gloves, large sterile drape, hand hygiene, and Betadine prep. The right femoral head was marked fluoroscopically. Under sterile conditions and local anesthesia, the right common femoral artery access was performed with a micropuncture needle. Under direct ultrasound guidance, the right common femoral was accessed with a  micropuncture kit. An ultrasound image was saved for documentation purposes. This allowed for placement of a 5-French vascular sheath. A limited arteriogram was performed through the side arm of the sheath confirming appropriate access within the right common femoral artery. Over a Bentson wire, a Mickelson catheter was advanced the caudal aspect of the thoracic aorta where was reformed, back bled and flushed. The Mickelson catheter was then utilized to select the superior mesenteric artery and a selective superior mesenteric arteriogram was performed. Next, the Mickelson catheter was utilized to attempt to cannulate the inferior mesenteric artery however this proved challenging given tortuosity of the abdominal aorta. As such, over Bentson wire, the Mickelson catheter was exchanged for an Omni Flush catheter and a flush abdominal aortogram was performed demarcating the origin of the IMA. Next, with the use of a 5 French angled glide catheter, the IMA was selected and a selective inferior mesenteric arteriogram was performed. With the use of a fathom 14 microwire, a regular Renegade microcatheter was advanced to the level of the trifurcation of the IMA and a sub selective inferior mesenteric arteriogram was performed. The microcatheter was then advanced to select the sigmoidal artery and a selective sigmoidal arteriogram was performed. The microcatheter was advanced into the distal arcade of the sigmoidal artery, beyond the location of the distal tributaries supplying the ill-defined area of contrast extravasation within the distal descending/proximal sigmoid colon. Contrast injection confirmed appropriate positioning and the short-segment of the distal arcade was percutaneously coil embolized with overlapping 2 mm diameter interlock coils. The microcatheter was retracted to the level of the sigmoidal artery and a post embolization sigmoidal arteriogram was performed. The microcatheter was then utilized to select  the left colic artery and a selective left colic arteriogram was performed Images were reviewed and the procedure was terminated. All wires, catheters and sheaths were removed from the patient. Hemostasis was achieved at the right groin access site with manual compression. The patient tolerated the procedure well without immediate post procedural complication. FINDINGS: Selective inferior mesenteric arteriogram demonstrates a conventional branching pattern and is negative for significant arterial supply to the distal descending/proximal sigmoid colon. No discrete areas of vessel irregularity or contrast extravasation are identified. Flush abdominal aortogram demonstrates patency of the IMA. The abdominal aorta is noted to be tortuous but of normal caliber as are the bilateral common and internal iliac arteries. Selective inferior mesenteric arteriogram demonstrates a conventional branching pattern. A ill-defined area of contrast extravasation is seen at the level of the distal descending/proximal sigmoid colon, correlating with the findings on preceding CTA. Dominant arterial supply to this area of contrast extravasation is via the sigmoidal artery. Sub selective sigmoidal arteriogram confirms an ill-defined area of contrast extravasation supplied via a tiny tertiary branch from the distal arcade of the sigmoidal artery. The distal arcade was percutaneously coil embolized across the origin of the tiny tertiary branch supplying the ill-defined area of contrast extravasation. Post embolization selective arteriograms performed both from the level of the sigmoidal and left colic arteries are  negative for persistent active extravasation. IMPRESSION: Technically successful percutaneous coil embolization of a distal arcade of the sigmoidal artery across the origin of a tiny tertiary branch supplying an ill-defined area of contrast extravasation at the level of the distal descending/proximal sigmoid colon, correlating with  the findings seen on preceding CTA. PLAN: - The patient is to remain flat for 4 hours with right leg straight. - The patient will continue to experience several additional bloody bowel movements and may continue to require additional resuscitation (as she was bleeding both before and during the procedure), however ultimately I am hopeful she will stabilize in the coming days. - While presumably secondary to diverticular disease, repeat colonoscopy after the resolution of acute symptoms is advised to exclude the presence of an underlying mass/lesion. Electronically Signed   By: Sandi Mariscal M.D.   On: 04/24/2021 08:53   IR Angiogram Visceral Selective  Result Date: 04/24/2021 INDICATION: Acute lower GI bleeding. Positive CTA for active diverticular bleeding within the distal descending/proximal sigmoid colon. Please perform mesenteric arteriogram and percutaneous embolization as indicated. EXAM: 1. ULTRASOUND GUIDANCE FOR ARTERIAL ACCESS 2. SELECTIVE SUPERIOR MESENTERIC ARTERIOGRAM 3. FLUSH AORTOGRAM 4. SELECTIVE INFERIOR MESENTERIC ARTERIOGRAM 5. COMPARISON:  CTA abdomen and pelvis - earlier same day MEDICATIONS: None ANESTHESIA/SEDATION: Moderate (conscious) sedation was employed during this procedure as administered by the Interventional Radiology RN. A total of Versed 4 mg and Fentanyl 100 mcg was administered intravenously. Moderate Sedation Time: 60 minutes. The patient's level of consciousness and vital signs were monitored continuously by radiology nursing throughout the procedure under my direct supervision. CONTRAST:  60 cc Omnipaque 300 FLUOROSCOPY TIME:  19.6 minutes (482 mGy) COMPLICATIONS: None immediate. PROCEDURE: Informed consent was obtained from the patient following explanation of the procedure, risks, benefits and alternatives. All questions were addressed. A time out was performed prior to the initiation of the procedure. Maximal barrier sterile technique utilized including caps, mask,  sterile gowns, sterile gloves, large sterile drape, hand hygiene, and Betadine prep. The right femoral head was marked fluoroscopically. Under sterile conditions and local anesthesia, the right common femoral artery access was performed with a micropuncture needle. Under direct ultrasound guidance, the right common femoral was accessed with a micropuncture kit. An ultrasound image was saved for documentation purposes. This allowed for placement of a 5-French vascular sheath. A limited arteriogram was performed through the side arm of the sheath confirming appropriate access within the right common femoral artery. Over a Bentson wire, a Mickelson catheter was advanced the caudal aspect of the thoracic aorta where was reformed, back bled and flushed. The Mickelson catheter was then utilized to select the superior mesenteric artery and a selective superior mesenteric arteriogram was performed. Next, the Mickelson catheter was utilized to attempt to cannulate the inferior mesenteric artery however this proved challenging given tortuosity of the abdominal aorta. As such, over Bentson wire, the Mickelson catheter was exchanged for an Omni Flush catheter and a flush abdominal aortogram was performed demarcating the origin of the IMA. Next, with the use of a 5 French angled glide catheter, the IMA was selected and a selective inferior mesenteric arteriogram was performed. With the use of a fathom 14 microwire, a regular Renegade microcatheter was advanced to the level of the trifurcation of the IMA and a sub selective inferior mesenteric arteriogram was performed. The microcatheter was then advanced to select the sigmoidal artery and a selective sigmoidal arteriogram was performed. The microcatheter was advanced into the distal arcade of the sigmoidal artery, beyond the location  of the distal tributaries supplying the ill-defined area of contrast extravasation within the distal descending/proximal sigmoid colon. Contrast  injection confirmed appropriate positioning and the short-segment of the distal arcade was percutaneously coil embolized with overlapping 2 mm diameter interlock coils. The microcatheter was retracted to the level of the sigmoidal artery and a post embolization sigmoidal arteriogram was performed. The microcatheter was then utilized to select the left colic artery and a selective left colic arteriogram was performed Images were reviewed and the procedure was terminated. All wires, catheters and sheaths were removed from the patient. Hemostasis was achieved at the right groin access site with manual compression. The patient tolerated the procedure well without immediate post procedural complication. FINDINGS: Selective inferior mesenteric arteriogram demonstrates a conventional branching pattern and is negative for significant arterial supply to the distal descending/proximal sigmoid colon. No discrete areas of vessel irregularity or contrast extravasation are identified. Flush abdominal aortogram demonstrates patency of the IMA. The abdominal aorta is noted to be tortuous but of normal caliber as are the bilateral common and internal iliac arteries. Selective inferior mesenteric arteriogram demonstrates a conventional branching pattern. A ill-defined area of contrast extravasation is seen at the level of the distal descending/proximal sigmoid colon, correlating with the findings on preceding CTA. Dominant arterial supply to this area of contrast extravasation is via the sigmoidal artery. Sub selective sigmoidal arteriogram confirms an ill-defined area of contrast extravasation supplied via a tiny tertiary branch from the distal arcade of the sigmoidal artery. The distal arcade was percutaneously coil embolized across the origin of the tiny tertiary branch supplying the ill-defined area of contrast extravasation. Post embolization selective arteriograms performed both from the level of the sigmoidal and left colic  arteries are negative for persistent active extravasation. IMPRESSION: Technically successful percutaneous coil embolization of a distal arcade of the sigmoidal artery across the origin of a tiny tertiary branch supplying an ill-defined area of contrast extravasation at the level of the distal descending/proximal sigmoid colon, correlating with the findings seen on preceding CTA. PLAN: - The patient is to remain flat for 4 hours with right leg straight. - The patient will continue to experience several additional bloody bowel movements and may continue to require additional resuscitation (as she was bleeding both before and during the procedure), however ultimately I am hopeful she will stabilize in the coming days. - While presumably secondary to diverticular disease, repeat colonoscopy after the resolution of acute symptoms is advised to exclude the presence of an underlying mass/lesion. Electronically Signed   By: Sandi Mariscal M.D.   On: 04/24/2021 08:53   IR Angiogram Selective Each Additional Vessel  Result Date: 04/24/2021 INDICATION: Acute lower GI bleeding. Positive CTA for active diverticular bleeding within the distal descending/proximal sigmoid colon. Please perform mesenteric arteriogram and percutaneous embolization as indicated. EXAM: 1. ULTRASOUND GUIDANCE FOR ARTERIAL ACCESS 2. SELECTIVE SUPERIOR MESENTERIC ARTERIOGRAM 3. FLUSH AORTOGRAM 4. SELECTIVE INFERIOR MESENTERIC ARTERIOGRAM 5. COMPARISON:  CTA abdomen and pelvis - earlier same day MEDICATIONS: None ANESTHESIA/SEDATION: Moderate (conscious) sedation was employed during this procedure as administered by the Interventional Radiology RN. A total of Versed 4 mg and Fentanyl 100 mcg was administered intravenously. Moderate Sedation Time: 60 minutes. The patient's level of consciousness and vital signs were monitored continuously by radiology nursing throughout the procedure under my direct supervision. CONTRAST:  60 cc Omnipaque 300 FLUOROSCOPY  TIME:  19.6 minutes (427 mGy) COMPLICATIONS: None immediate. PROCEDURE: Informed consent was obtained from the patient following explanation of the procedure, risks, benefits and  alternatives. All questions were addressed. A time out was performed prior to the initiation of the procedure. Maximal barrier sterile technique utilized including caps, mask, sterile gowns, sterile gloves, large sterile drape, hand hygiene, and Betadine prep. The right femoral head was marked fluoroscopically. Under sterile conditions and local anesthesia, the right common femoral artery access was performed with a micropuncture needle. Under direct ultrasound guidance, the right common femoral was accessed with a micropuncture kit. An ultrasound image was saved for documentation purposes. This allowed for placement of a 5-French vascular sheath. A limited arteriogram was performed through the side arm of the sheath confirming appropriate access within the right common femoral artery. Over a Bentson wire, a Mickelson catheter was advanced the caudal aspect of the thoracic aorta where was reformed, back bled and flushed. The Mickelson catheter was then utilized to select the superior mesenteric artery and a selective superior mesenteric arteriogram was performed. Next, the Mickelson catheter was utilized to attempt to cannulate the inferior mesenteric artery however this proved challenging given tortuosity of the abdominal aorta. As such, over Bentson wire, the Mickelson catheter was exchanged for an Omni Flush catheter and a flush abdominal aortogram was performed demarcating the origin of the IMA. Next, with the use of a 5 French angled glide catheter, the IMA was selected and a selective inferior mesenteric arteriogram was performed. With the use of a fathom 14 microwire, a regular Renegade microcatheter was advanced to the level of the trifurcation of the IMA and a sub selective inferior mesenteric arteriogram was performed. The  microcatheter was then advanced to select the sigmoidal artery and a selective sigmoidal arteriogram was performed. The microcatheter was advanced into the distal arcade of the sigmoidal artery, beyond the location of the distal tributaries supplying the ill-defined area of contrast extravasation within the distal descending/proximal sigmoid colon. Contrast injection confirmed appropriate positioning and the short-segment of the distal arcade was percutaneously coil embolized with overlapping 2 mm diameter interlock coils. The microcatheter was retracted to the level of the sigmoidal artery and a post embolization sigmoidal arteriogram was performed. The microcatheter was then utilized to select the left colic artery and a selective left colic arteriogram was performed Images were reviewed and the procedure was terminated. All wires, catheters and sheaths were removed from the patient. Hemostasis was achieved at the right groin access site with manual compression. The patient tolerated the procedure well without immediate post procedural complication. FINDINGS: Selective inferior mesenteric arteriogram demonstrates a conventional branching pattern and is negative for significant arterial supply to the distal descending/proximal sigmoid colon. No discrete areas of vessel irregularity or contrast extravasation are identified. Flush abdominal aortogram demonstrates patency of the IMA. The abdominal aorta is noted to be tortuous but of normal caliber as are the bilateral common and internal iliac arteries. Selective inferior mesenteric arteriogram demonstrates a conventional branching pattern. A ill-defined area of contrast extravasation is seen at the level of the distal descending/proximal sigmoid colon, correlating with the findings on preceding CTA. Dominant arterial supply to this area of contrast extravasation is via the sigmoidal artery. Sub selective sigmoidal arteriogram confirms an ill-defined area of contrast  extravasation supplied via a tiny tertiary branch from the distal arcade of the sigmoidal artery. The distal arcade was percutaneously coil embolized across the origin of the tiny tertiary branch supplying the ill-defined area of contrast extravasation. Post embolization selective arteriograms performed both from the level of the sigmoidal and left colic arteries are negative for persistent active extravasation. IMPRESSION: Technically successful percutaneous  coil embolization of a distal arcade of the sigmoidal artery across the origin of a tiny tertiary branch supplying an ill-defined area of contrast extravasation at the level of the distal descending/proximal sigmoid colon, correlating with the findings seen on preceding CTA. PLAN: - The patient is to remain flat for 4 hours with right leg straight. - The patient will continue to experience several additional bloody bowel movements and may continue to require additional resuscitation (as she was bleeding both before and during the procedure), however ultimately I am hopeful she will stabilize in the coming days. - While presumably secondary to diverticular disease, repeat colonoscopy after the resolution of acute symptoms is advised to exclude the presence of an underlying mass/lesion. Electronically Signed   By: Sandi Mariscal M.D.   On: 04/24/2021 08:53   IR Angiogram Selective Each Additional Vessel  Result Date: 04/24/2021 INDICATION: Acute lower GI bleeding. Positive CTA for active diverticular bleeding within the distal descending/proximal sigmoid colon. Please perform mesenteric arteriogram and percutaneous embolization as indicated. EXAM: 1. ULTRASOUND GUIDANCE FOR ARTERIAL ACCESS 2. SELECTIVE SUPERIOR MESENTERIC ARTERIOGRAM 3. FLUSH AORTOGRAM 4. SELECTIVE INFERIOR MESENTERIC ARTERIOGRAM 5. COMPARISON:  CTA abdomen and pelvis - earlier same day MEDICATIONS: None ANESTHESIA/SEDATION: Moderate (conscious) sedation was employed during this procedure as  administered by the Interventional Radiology RN. A total of Versed 4 mg and Fentanyl 100 mcg was administered intravenously. Moderate Sedation Time: 60 minutes. The patient's level of consciousness and vital signs were monitored continuously by radiology nursing throughout the procedure under my direct supervision. CONTRAST:  60 cc Omnipaque 300 FLUOROSCOPY TIME:  19.6 minutes (643 mGy) COMPLICATIONS: None immediate. PROCEDURE: Informed consent was obtained from the patient following explanation of the procedure, risks, benefits and alternatives. All questions were addressed. A time out was performed prior to the initiation of the procedure. Maximal barrier sterile technique utilized including caps, mask, sterile gowns, sterile gloves, large sterile drape, hand hygiene, and Betadine prep. The right femoral head was marked fluoroscopically. Under sterile conditions and local anesthesia, the right common femoral artery access was performed with a micropuncture needle. Under direct ultrasound guidance, the right common femoral was accessed with a micropuncture kit. An ultrasound image was saved for documentation purposes. This allowed for placement of a 5-French vascular sheath. A limited arteriogram was performed through the side arm of the sheath confirming appropriate access within the right common femoral artery. Over a Bentson wire, a Mickelson catheter was advanced the caudal aspect of the thoracic aorta where was reformed, back bled and flushed. The Mickelson catheter was then utilized to select the superior mesenteric artery and a selective superior mesenteric arteriogram was performed. Next, the Mickelson catheter was utilized to attempt to cannulate the inferior mesenteric artery however this proved challenging given tortuosity of the abdominal aorta. As such, over Bentson wire, the Mickelson catheter was exchanged for an Omni Flush catheter and a flush abdominal aortogram was performed demarcating the origin  of the IMA. Next, with the use of a 5 French angled glide catheter, the IMA was selected and a selective inferior mesenteric arteriogram was performed. With the use of a fathom 14 microwire, a regular Renegade microcatheter was advanced to the level of the trifurcation of the IMA and a sub selective inferior mesenteric arteriogram was performed. The microcatheter was then advanced to select the sigmoidal artery and a selective sigmoidal arteriogram was performed. The microcatheter was advanced into the distal arcade of the sigmoidal artery, beyond the location of the distal tributaries supplying the ill-defined  area of contrast extravasation within the distal descending/proximal sigmoid colon. Contrast injection confirmed appropriate positioning and the short-segment of the distal arcade was percutaneously coil embolized with overlapping 2 mm diameter interlock coils. The microcatheter was retracted to the level of the sigmoidal artery and a post embolization sigmoidal arteriogram was performed. The microcatheter was then utilized to select the left colic artery and a selective left colic arteriogram was performed Images were reviewed and the procedure was terminated. All wires, catheters and sheaths were removed from the patient. Hemostasis was achieved at the right groin access site with manual compression. The patient tolerated the procedure well without immediate post procedural complication. FINDINGS: Selective inferior mesenteric arteriogram demonstrates a conventional branching pattern and is negative for significant arterial supply to the distal descending/proximal sigmoid colon. No discrete areas of vessel irregularity or contrast extravasation are identified. Flush abdominal aortogram demonstrates patency of the IMA. The abdominal aorta is noted to be tortuous but of normal caliber as are the bilateral common and internal iliac arteries. Selective inferior mesenteric arteriogram demonstrates a conventional  branching pattern. A ill-defined area of contrast extravasation is seen at the level of the distal descending/proximal sigmoid colon, correlating with the findings on preceding CTA. Dominant arterial supply to this area of contrast extravasation is via the sigmoidal artery. Sub selective sigmoidal arteriogram confirms an ill-defined area of contrast extravasation supplied via a tiny tertiary branch from the distal arcade of the sigmoidal artery. The distal arcade was percutaneously coil embolized across the origin of the tiny tertiary branch supplying the ill-defined area of contrast extravasation. Post embolization selective arteriograms performed both from the level of the sigmoidal and left colic arteries are negative for persistent active extravasation. IMPRESSION: Technically successful percutaneous coil embolization of a distal arcade of the sigmoidal artery across the origin of a tiny tertiary branch supplying an ill-defined area of contrast extravasation at the level of the distal descending/proximal sigmoid colon, correlating with the findings seen on preceding CTA. PLAN: - The patient is to remain flat for 4 hours with right leg straight. - The patient will continue to experience several additional bloody bowel movements and may continue to require additional resuscitation (as she was bleeding both before and during the procedure), however ultimately I am hopeful she will stabilize in the coming days. - While presumably secondary to diverticular disease, repeat colonoscopy after the resolution of acute symptoms is advised to exclude the presence of an underlying mass/lesion. Electronically Signed   By: Sandi Mariscal M.D.   On: 04/24/2021 08:53   IR US Guide Vasc Access Right  Result Date: 04/24/2021 INDICATION: Acute lower GI bleeding. Positive CTA for active diverticular bleeding within the distal descending/proximal sigmoid colon. Please perform mesenteric arteriogram and percutaneous embolization as  indicated. EXAM: 1. ULTRASOUND GUIDANCE FOR ARTERIAL ACCESS 2. SELECTIVE SUPERIOR MESENTERIC ARTERIOGRAM 3. FLUSH AORTOGRAM 4. SELECTIVE INFERIOR MESENTERIC ARTERIOGRAM 5. COMPARISON:  CTA abdomen and pelvis - earlier same day MEDICATIONS: None ANESTHESIA/SEDATION: Moderate (conscious) sedation was employed during this procedure as administered by the Interventional Radiology RN. A total of Versed 4 mg and Fentanyl 100 mcg was administered intravenously. Moderate Sedation Time: 60 minutes. The patient's level of consciousness and vital signs were monitored continuously by radiology nursing throughout the procedure under my direct supervision. CONTRAST:  60 cc Omnipaque 300 FLUOROSCOPY TIME:  19.6 minutes (616 mGy) COMPLICATIONS: None immediate. PROCEDURE: Informed consent was obtained from the patient following explanation of the procedure, risks, benefits and alternatives. All questions were addressed. A time out  was performed prior to the initiation of the procedure. Maximal barrier sterile technique utilized including caps, mask, sterile gowns, sterile gloves, large sterile drape, hand hygiene, and Betadine prep. The right femoral head was marked fluoroscopically. Under sterile conditions and local anesthesia, the right common femoral artery access was performed with a micropuncture needle. Under direct ultrasound guidance, the right common femoral was accessed with a micropuncture kit. An ultrasound image was saved for documentation purposes. This allowed for placement of a 5-French vascular sheath. A limited arteriogram was performed through the side arm of the sheath confirming appropriate access within the right common femoral artery. Over a Bentson wire, a Mickelson catheter was advanced the caudal aspect of the thoracic aorta where was reformed, back bled and flushed. The Mickelson catheter was then utilized to select the superior mesenteric artery and a selective superior mesenteric arteriogram was  performed. Next, the Mickelson catheter was utilized to attempt to cannulate the inferior mesenteric artery however this proved challenging given tortuosity of the abdominal aorta. As such, over Bentson wire, the Mickelson catheter was exchanged for an Omni Flush catheter and a flush abdominal aortogram was performed demarcating the origin of the IMA. Next, with the use of a 5 French angled glide catheter, the IMA was selected and a selective inferior mesenteric arteriogram was performed. With the use of a fathom 14 microwire, a regular Renegade microcatheter was advanced to the level of the trifurcation of the IMA and a sub selective inferior mesenteric arteriogram was performed. The microcatheter was then advanced to select the sigmoidal artery and a selective sigmoidal arteriogram was performed. The microcatheter was advanced into the distal arcade of the sigmoidal artery, beyond the location of the distal tributaries supplying the ill-defined area of contrast extravasation within the distal descending/proximal sigmoid colon. Contrast injection confirmed appropriate positioning and the short-segment of the distal arcade was percutaneously coil embolized with overlapping 2 mm diameter interlock coils. The microcatheter was retracted to the level of the sigmoidal artery and a post embolization sigmoidal arteriogram was performed. The microcatheter was then utilized to select the left colic artery and a selective left colic arteriogram was performed Images were reviewed and the procedure was terminated. All wires, catheters and sheaths were removed from the patient. Hemostasis was achieved at the right groin access site with manual compression. The patient tolerated the procedure well without immediate post procedural complication. FINDINGS: Selective inferior mesenteric arteriogram demonstrates a conventional branching pattern and is negative for significant arterial supply to the distal descending/proximal sigmoid  colon. No discrete areas of vessel irregularity or contrast extravasation are identified. Flush abdominal aortogram demonstrates patency of the IMA. The abdominal aorta is noted to be tortuous but of normal caliber as are the bilateral common and internal iliac arteries. Selective inferior mesenteric arteriogram demonstrates a conventional branching pattern. A ill-defined area of contrast extravasation is seen at the level of the distal descending/proximal sigmoid colon, correlating with the findings on preceding CTA. Dominant arterial supply to this area of contrast extravasation is via the sigmoidal artery. Sub selective sigmoidal arteriogram confirms an ill-defined area of contrast extravasation supplied via a tiny tertiary branch from the distal arcade of the sigmoidal artery. The distal arcade was percutaneously coil embolized across the origin of the tiny tertiary branch supplying the ill-defined area of contrast extravasation. Post embolization selective arteriograms performed both from the level of the sigmoidal and left colic arteries are negative for persistent active extravasation. IMPRESSION: Technically successful percutaneous coil embolization of a distal arcade of the  sigmoidal artery across the origin of a tiny tertiary branch supplying an ill-defined area of contrast extravasation at the level of the distal descending/proximal sigmoid colon, correlating with the findings seen on preceding CTA. PLAN: - The patient is to remain flat for 4 hours with right leg straight. - The patient will continue to experience several additional bloody bowel movements and may continue to require additional resuscitation (as she was bleeding both before and during the procedure), however ultimately I am hopeful she will stabilize in the coming days. - While presumably secondary to diverticular disease, repeat colonoscopy after the resolution of acute symptoms is advised to exclude the presence of an underlying  mass/lesion. Electronically Signed   By: Sandi Mariscal M.D.   On: 04/24/2021 08:53   ECHOCARDIOGRAM COMPLETE  Result Date: 04/25/2021    ECHOCARDIOGRAM REPORT   Patient Name:   Stephanie Bowers Date of Exam: 04/25/2021 Medical Rec #:  [id number]           Height:       66.0 in Accession #:    [id number]          Weight:       173.7 lb Date of Birth:  [birth year]           BSA:          1.884 m Patient Age:    39 years            BP:           143/80 mmHg Patient Gender: F                   HR:           82 bpm. Exam Location:  Inpatient Procedure: 2D Echo, 3D Echo, Cardiac Doppler and Color Doppler Indications:    R55 Syncope  History:        Patient has no prior history of Echocardiogram examinations.                 Risk Factors:Hypertension and Dyslipidemia. GI bleed.  Sonographer:    Roseanna Rainbow RDCS Referring Phys: [place]  1. Left ventricular ejection fraction, by estimation, is 55 to 60%. The left ventricle has normal function. The left ventricle has no regional wall motion abnormalities. There is moderate left ventricular hypertrophy. 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  2. Right ventricular systolic function is normal. The right ventricular size is normal.  3. The mitral valve is abnormal. Trivial mitral valve regurgitation.  4. The aortic valve is tricuspid. Aortic valve regurgitation is not visualized. Aortic valve sclerosis is present, with no evidence of aortic valve stenosis. FINDINGS  Left Ventricle: Left ventricular ejection fraction, by estimation, is 55 to 60%. The left ventricle has normal function. The left ventricle has no regional wall motion abnormalities. The left ventricular internal cavity size was small. There is moderate  left ventricular hypertrophy. Indeterminate diastolic filling due to E-A fusion. Right Ventricle: The right ventricular size is normal. Right vetricular wall thickness was not assessed. Right ventricular systolic function is  normal. Left Atrium: Left atrial size was normal in size. Right Atrium: Right atrial size was normal in size. Pericardium: There is no evidence of pericardial effusion. Mitral Valve: The mitral valve is abnormal. There is mild thickening of the mitral valve leaflet(s). Mild mitral annular calcification. Trivial mitral valve regurgitation. Tricuspid Valve: The tricuspid valve is normal in structure. Tricuspid valve regurgitation is trivial. Aortic Valve:  The aortic valve is tricuspid. Aortic valve regurgitation is not visualized. Aortic valve sclerosis is present, with no evidence of aortic valve stenosis. Aortic valve mean gradient measures 8.0 mmHg. Aortic valve peak gradient measures 15.8 mmHg. Aortic valve area, by VTI measures 2.26 cm. Pulmonic Valve: The pulmonic valve was not well visualized. Pulmonic valve regurgitation is not visualized. Aorta: The aortic root is normal in size and structure. IAS/Shunts: No atrial level shunt detected by color flow Doppler.  LEFT VENTRICLE PLAX 2D LVIDd:         3.20 cm     Diastology LVIDs:         2.35 cm     LV e' medial:    6.09 cm/s LV PW:         1.20 cm     LV E/e' medial:  14.9 LV IVS:        1.50 cm     LV e' lateral:   6.20 cm/s LVOT diam:     2.00 cm     LV E/e' lateral: 14.6 LV SV:         82 LV SV Index:   44 LVOT Area:     3.14 cm                             3D Volume EF: LV Volumes (MOD)           3D EF:        59 % LV vol d, MOD A2C: 67.0 ml LV EDV:       99 ml LV vol d, MOD A4C: 61.4 ml LV ESV:       40 ml LV vol s, MOD A2C: 24.2 ml LV SV:        59 ml LV vol s, MOD A4C: 22.0 ml LV SV MOD A2C:     42.8 ml LV SV MOD A4C:     61.4 ml LV SV MOD BP:      42.3 ml RIGHT VENTRICLE             IVC RV S prime:     18.50 cm/s  IVC diam: 1.40 cm TAPSE (M-mode): 2.7 cm LEFT ATRIUM             Index        RIGHT ATRIUM           Index LA diam:        3.30 cm 1.75 cm/m   RA Area:     12.60 cm LA Vol (A2C):   55.2 ml 29.31 ml/m  RA Volume:   29.90 ml  15.87 ml/m LA  Vol (A4C):   37.5 ml 19.91 ml/m LA Biplane Vol: 44.9 ml 23.84 ml/m  AORTIC VALVE AV Area (Vmax):    2.48 cm AV Area (Vmean):   2.38 cm AV Area (VTI):     2.26 cm AV Vmax:           198.67 cm/s AV Vmean:          129.333 cm/s AV VTI:            0.363 m AV Peak Grad:      15.8 mmHg AV Mean Grad:      8.0 mmHg LVOT Vmax:         157.00 cm/s LVOT Vmean:        98.100 cm/s LVOT VTI:  0.261 m LVOT/AV VTI ratio: 0.72  AORTA Ao Root diam: 3.20 cm Ao Asc diam:  3.70 cm MITRAL VALVE MV Area (PHT): 2.07 cm     SHUNTS MV Decel Time: 366 msec     Systemic VTI:  0.26 m MV E velocity: 90.60 cm/s   Systemic Diam: 2.00 cm MV A velocity: 118.00 cm/s MV E/A ratio:  0.77 Dorris Carnes MD Electronically signed by Dorris Carnes MD Signature Date/Time: 04/25/2021/1:41:35 PM    Final    IR EMBO ARTERIAL NOT HEMORR HEMANG INC GUIDE ROADMAPPING  Result Date: 04/24/2021 INDICATION: Acute lower GI bleeding. Positive CTA for active diverticular bleeding within the distal descending/proximal sigmoid colon. Please perform mesenteric arteriogram and percutaneous embolization as indicated. EXAM: 1. ULTRASOUND GUIDANCE FOR ARTERIAL ACCESS 2. SELECTIVE SUPERIOR MESENTERIC ARTERIOGRAM 3. FLUSH AORTOGRAM 4. SELECTIVE INFERIOR MESENTERIC ARTERIOGRAM 5. COMPARISON:  CTA abdomen and pelvis - earlier same day MEDICATIONS: None ANESTHESIA/SEDATION: Moderate (conscious) sedation was employed during this procedure as administered by the Interventional Radiology RN. A total of Versed 4 mg and Fentanyl 100 mcg was administered intravenously. Moderate Sedation Time: 60 minutes. The patient's level of consciousness and vital signs were monitored continuously by radiology nursing throughout the procedure under my direct supervision. CONTRAST:  60 cc Omnipaque 300 FLUOROSCOPY TIME:  19.6 minutes (030 mGy) COMPLICATIONS: None immediate. PROCEDURE: Informed consent was obtained from the patient following explanation of the procedure, risks, benefits and  alternatives. All questions were addressed. A time out was performed prior to the initiation of the procedure. Maximal barrier sterile technique utilized including caps, mask, sterile gowns, sterile gloves, large sterile drape, hand hygiene, and Betadine prep. The right femoral head was marked fluoroscopically. Under sterile conditions and local anesthesia, the right common femoral artery access was performed with a micropuncture needle. Under direct ultrasound guidance, the right common femoral was accessed with a micropuncture kit. An ultrasound image was saved for documentation purposes. This allowed for placement of a 5-French vascular sheath. A limited arteriogram was performed through the side arm of the sheath confirming appropriate access within the right common femoral artery. Over a Bentson wire, a Mickelson catheter was advanced the caudal aspect of the thoracic aorta where was reformed, back bled and flushed. The Mickelson catheter was then utilized to select the superior mesenteric artery and a selective superior mesenteric arteriogram was performed. Next, the Mickelson catheter was utilized to attempt to cannulate the inferior mesenteric artery however this proved challenging given tortuosity of the abdominal aorta. As such, over Bentson wire, the Mickelson catheter was exchanged for an Omni Flush catheter and a flush abdominal aortogram was performed demarcating the origin of the IMA. Next, with the use of a 5 French angled glide catheter, the IMA was selected and a selective inferior mesenteric arteriogram was performed. With the use of a fathom 14 microwire, a regular Renegade microcatheter was advanced to the level of the trifurcation of the IMA and a sub selective inferior mesenteric arteriogram was performed. The microcatheter was then advanced to select the sigmoidal artery and a selective sigmoidal arteriogram was performed. The microcatheter was advanced into the distal arcade of the sigmoidal  artery, beyond the location of the distal tributaries supplying the ill-defined area of contrast extravasation within the distal descending/proximal sigmoid colon. Contrast injection confirmed appropriate positioning and the short-segment of the distal arcade was percutaneously coil embolized with overlapping 2 mm diameter interlock coils. The microcatheter was retracted to the level of the sigmoidal artery and a post embolization sigmoidal arteriogram  was performed. The microcatheter was then utilized to select the left colic artery and a selective left colic arteriogram was performed Images were reviewed and the procedure was terminated. All wires, catheters and sheaths were removed from the patient. Hemostasis was achieved at the right groin access site with manual compression. The patient tolerated the procedure well without immediate post procedural complication. FINDINGS: Selective inferior mesenteric arteriogram demonstrates a conventional branching pattern and is negative for significant arterial supply to the distal descending/proximal sigmoid colon. No discrete areas of vessel irregularity or contrast extravasation are identified. Flush abdominal aortogram demonstrates patency of the IMA. The abdominal aorta is noted to be tortuous but of normal caliber as are the bilateral common and internal iliac arteries. Selective inferior mesenteric arteriogram demonstrates a conventional branching pattern. A ill-defined area of contrast extravasation is seen at the level of the distal descending/proximal sigmoid colon, correlating with the findings on preceding CTA. Dominant arterial supply to this area of contrast extravasation is via the sigmoidal artery. Sub selective sigmoidal arteriogram confirms an ill-defined area of contrast extravasation supplied via a tiny tertiary branch from the distal arcade of the sigmoidal artery. The distal arcade was percutaneously coil embolized across the origin of the tiny  tertiary branch supplying the ill-defined area of contrast extravasation. Post embolization selective arteriograms performed both from the level of the sigmoidal and left colic arteries are negative for persistent active extravasation. IMPRESSION: Technically successful percutaneous coil embolization of a distal arcade of the sigmoidal artery across the origin of a tiny tertiary branch supplying an ill-defined area of contrast extravasation at the level of the distal descending/proximal sigmoid colon, correlating with the findings seen on preceding CTA. PLAN: - The patient is to remain flat for 4 hours with right leg straight. - The patient will continue to experience several additional bloody bowel movements and may continue to require additional resuscitation (as she was bleeding both before and during the procedure), however ultimately I am hopeful she will stabilize in the coming days. - While presumably secondary to diverticular disease, repeat colonoscopy after the resolution of acute symptoms is advised to exclude the presence of an underlying mass/lesion. Electronically Signed   By: Sandi Mariscal M.D.   On: 04/24/2021 08:53   VAS [place] LOWER EXTREMITY VENOUS (DVT)  Result Date: 04/25/2021  Lower Venous DVT Study Patient Name:  Stephanie Bowers  Date of Exam:   04/25/2021 Medical Rec #: [id number]            Accession #:    [id number] Date of Birth: Jul 19, 1945            Patient Gender: F Patient Age:   52 years Exam Location:  [facility] Procedure:      VAS [place] LOWER EXTREMITY VENOUS (DVT) Referring Phys: Oren Binet --------------------------------------------------------------------------------  Indications: Pulmonary embolism.  Comparison Study: no prior Performing Technologist: Archie Patten RVS  Examination Guidelines: A complete evaluation includes B-mode imaging, spectral Doppler, color Doppler, and power Doppler as needed of all accessible portions of each vessel. Bilateral testing is  considered an integral part of a complete examination. Limited examinations for reoccurring indications may be performed as noted. The reflux portion of the exam is performed with the patient in reverse Trendelenburg.  +---------+---------------+---------+-----------+----------+-----------------+ RIGHT    CompressibilityPhasicitySpontaneityPropertiesThrombus Aging    +---------+---------------+---------+-----------+----------+-----------------+ CFV      Full           Yes      Yes                                    +---------+---------------+---------+-----------+----------+-----------------+  SFJ      Full                                                           +---------+---------------+---------+-----------+----------+-----------------+ FV Prox  Full                                                           +---------+---------------+---------+-----------+----------+-----------------+ FV Mid   Full                                                           +---------+---------------+---------+-----------+----------+-----------------+ FV DistalFull                                                           +---------+---------------+---------+-----------+----------+-----------------+ PFV      Full                                                           +---------+---------------+---------+-----------+----------+-----------------+ POP      Full           Yes      Yes                                    +---------+---------------+---------+-----------+----------+-----------------+ PTV      Full                                                           +---------+---------------+---------+-----------+----------+-----------------+ PERO     None                                         Age Indeterminate +---------+---------------+---------+-----------+----------+-----------------+    +---------+---------------+---------+-----------+----------+--------------+ LEFT     CompressibilityPhasicitySpontaneityPropertiesThrombus Aging +---------+---------------+---------+-----------+----------+--------------+ CFV      Full           Yes      Yes                                 +---------+---------------+---------+-----------+----------+--------------+ SFJ      Full                                                        +---------+---------------+---------+-----------+----------+--------------+  FV Prox  Full                                                        +---------+---------------+---------+-----------+----------+--------------+ FV Mid   Full                                                        +---------+---------------+---------+-----------+----------+--------------+ FV DistalFull                                                        +---------+---------------+---------+-----------+----------+--------------+ PFV      Full                                                        +---------+---------------+---------+-----------+----------+--------------+ POP      Full           Yes      Yes                                 +---------+---------------+---------+-----------+----------+--------------+ PTV      Full                                                        +---------+---------------+---------+-----------+----------+--------------+ PERO     Full                                                        +---------+---------------+---------+-----------+----------+--------------+     Summary: RIGHT: - Findings consistent with age indeterminate deep vein thrombosis involving the right peroneal veins. - No cystic structure found in the popliteal fossa.  LEFT: - There is no evidence of deep vein thrombosis in the lower extremity.  - No cystic structure found in the popliteal fossa.  *See table(s) above for measurements and  observations.    Preliminary    CT Angio Abd/Pel w/ and/or w/o  Result Date: 04/23/2021 CLINICAL DATA:  GI bleed, bright red blood per rectum EXAM: CTA ABDOMEN AND PELVIS WITHOUT AND WITH CONTRAST TECHNIQUE: Multidetector CT imaging of the abdomen and pelvis was performed using the standard protocol during bolus administration of intravenous contrast. Multiplanar reconstructed images and MIPs were obtained and reviewed to evaluate the vascular anatomy. CONTRAST:  136m OMNIPAQUE IOHEXOL 350 MG/ML SOLN IV COMPARISON:  None FINDINGS: VASCULAR Aorta: Atherosclerotic calcifications of tortuous abdominal aorta without aneurysm. No intramural hematoma on precontrast imaging. No dissection. No significant thrombus. Celiac: Mild plaque at origin.  No significant  stenosis. SMA: Widely patent Renals: Plaque formation at BILATERAL renal artery origins, not significant on LEFT, estimated 50% on RIGHT. IMA: Patent origin Inflow: Atherosclerotic calcifications and tortuosity of common iliac arteries. Additional calcified plaque within internal iliac arteries bilaterally. Proximal Outflow: Mild plaque at common femoral arteries and bifurcations Veins: Patent Review of the MIP images confirms the above findings. NON-VASCULAR Lower chest: Lung bases clear Hepatobiliary: Cyst at central liver 2.2 x 1.7 cm series 6, image 16. Gallbladder and liver otherwise unremarkable. Pancreas: Normal appearance Spleen: Normal appearance Adrenals/Urinary Tract: Tiny BILATERAL renal cysts. Adrenal glands, kidneys, ureters, and bladder otherwise normal appearance. Stomach/Bowel: Diffuse diverticulosis of descending and sigmoid colon. Active site of IV contrast extravasation identified at the distal descending colon near the descending sigmoid junction, where a single diverticulum which is low attenuation on precontrast imaging demonstrates high attenuation postcontrast, and delayed images showing passage of extravasated blood into the colonic  lumen. Findings consistent with a site of active GI bleeding. No additional sites of abnormal contrast extravasation identified. Stomach and remaining bowel loops unremarkable. Lymphatic: No adenopathy Reproductive: Unremarkable uterus and ovaries Other: No free air or free fluid.  No hernia. Musculoskeletal: Diffuse osseous demineralization. IMPRESSION: VASCULAR Scattered atherosclerotic plaque formation without evidence of critical stenosis. 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 No evidence of aneurysm or dissection. NON-VASCULAR Active site of IV contrast extravasation at a diverticulum at the distal descending colon near the descending sigmoid junction with contrast extending in to the colonic lumen on delayed images consistent with active GI bleeding. Aortic Atherosclerosis (ICD10-I70.0). Findings discussed with Dr. Tarri Glenn on 04/23/2021 at 1943 hours. Electronically Signed   By: Lavonia Dana M.D.   On: 04/23/2021 19:51    Labs:  CBC: Recent Labs    04/24/21 1545 04/25/21 0139 04/25/21 0713 04/25/21 1235  WBC 13.2* 10.6* 10.1 11.4*  HGB 10.2* 9.5* 9.8* 9.6*  HCT 32.4* 29.4* 30.5* 29.7*  PLT 204 179 189 189    COAGS: Recent Labs    04/23/21 0441  INR 1.0    BMP: Recent Labs    04/24/21 0235 04/24/21 0740 04/24/21 1419 04/25/21 0713  NA 137 136 135 138  K 3.8 4.0 3.7 3.3*  CL 108 107 107 107  CO2 _0 GLUCOSE 110* 103* 119* 104*  BUN _1 CALCIUM 8.1* 8.1* 7.8* 8.3*  CREATININE 0.50 0.59 0.62 0.54  GFRNONAA >60 >60 >60 >60    LIVER FUNCTION TESTS: Recent Labs    04/23/21 0441 04/24/21 0740 04/24/21 1419 04/25/21 0713  BILITOT 0.8 0.6 0.7 1.0  AST _2 ALT _3 ALKPHOS 68 59 59 64  PROT 5.7* 5.3* 5.1* 5.3*  ALBUMIN 3.2* 2.9* 2.8* 2.9*    Assessment and Plan:  Status post sigmoid/diverticular artery embolization --no new bleeding --hemoglobin stable  PE --managed by Medicine   IR available as  needed  Electronically Signed: Pasty Spillers, PA 04/25/2021, 2:31 PM   I spent a total of 35 Minutes at the the patient's bedside AND on the patient's hospital floor or unit, greater than 50% of which was counseling/coordinating care for GI bleed

## 2021-04-25 NOTE — Progress Notes (Addendum)
PROGRESS NOTE        PATIENT DETAILS Name: Stephanie Bowers Age: 75 y.o. Sex: female Date of Birth: 1945-12-20 Admit Date: 04/22/2021 Admitting Physician Christel Mormon, MD [id number], Cecille Rubin, MD  Brief Narrative: Patient is a 75 y.o. female with history of HTN, HLD, GERD-who presented with lower GI bleeding-thought to be due to diverticular etiology.  She was subsequently admitted to the hospitalist service-a CT angiogram of the abdomen was positive-she underwent embolization procedure by IR.  On 11/27-she sustained a syncopal episode after having a bowel movement-a CT angiogram of the chest on 11/28 was positive for PE.  See below for further details.  Subjective: Some dried dark blood on the bedsheet per RN-but no fresh bleeding evident.  Complains of chest pain-that is pleuritic in nature (got few chest compression when she syncopized)  Objective: Vitals: Blood pressure (!) 143/80, pulse 80, temperature 99.3 F (37.4 C), temperature source Oral, resp. rate 18, height $RemoveBe'5\' 6"'scZWjRCKe$  (1.676 m), weight 78.8 kg, SpO2 96 %.   Gen Exam:Alert awake-not in any distress HEENT:atraumatic, normocephalic Chest: B/L clear to auscultation anteriorly CVS:S1S2 regular Abdomen:soft non tender, non distended Extremities:no edema Neurology: Non focal Skin: no rash   Pertinent Labs/Radiology: Recent Labs  Lab 04/22/21 2117 04/23/21 0441 04/23/21 0938 04/24/21 0740 04/24/21 1419 04/24/21 1545 04/25/21 0713  WBC 12.1* 10.1   < > 9.6 10.4   < > 10.1  HGB 13.3 12.2   < > 10.4* 9.7*   < > 9.8*  PLT 241 216   < > 178 189   < > 189  NA 138 137   < > 136 135  --  138  K 3.8 3.4*   < > 4.0 3.7  --  3.3*  CREATININE 0.50 0.52   < > 0.59 0.62  --  0.54  AST 15 17  --  15 18  --  16  ALT 14 17  --  12 14  --  15  ALKPHOS 74 68  --  59 59  --  64  BILITOT 0.5 0.8  --  0.6 0.7  --  1.0   < > = values in this interval not displayed.      Assessment/Plan: Lower GI bleeding  with acute blood loss anemia: Suspect bleeding has more or less subsided-Per RN-she had some dried/dark-colored blood earlier-given stability in hemoglobin-this is felt to represent old blood rather than fresh GI bleeding.  Plan is to continue to monitor closely-she was found to have a PE-and will be started on IV heparin-see below.  Continue to follow CBC closely.  Syncope: Occurred on 11/27-after she had a BM.  Was very brief-nursing staff initiated chest compressions-before she spontaneously recovered consciousness.  She never lost pulse.  Initially this was felt to be probably vagal etiology given that this occurred after she had a BM-however she does have a PE as well-and that could be the culprit.  Await echo-continue to monitor in telemetry.  Cautiously starting IV heparin.  See below.  Pulmonary embolism: See above-unclear whether she syncopized from PE or from vagal mechanism after she had a BM.  Difficult situation-she does not appear to have active diverticular bleeding at this point-she appears to be passing old dark blood.  Discussed at length with GI MD-Dr. Arvid Right will initiate IV heparin without bolus and attempt to maintain at  low therapeutic range-and follow clinically/CBC closely.  If she has recurrent bleeding or significant drop in hemoglobin-we we will need to stop anticoagulation.  Have ordered lower extremity Dopplers as well-if she does have DVT-May need IVC filter if we cannot anticoagulate in the event of bleeding.Have discussed with patient/spouse at bedside-they are agreeable with this plan.  Chest pain: Either from chest compressions as she received when she syncopized on 11/27 or due to PE.  Episodes of narrow complex tachycardia on 11/27 evening: Reviewed with cardiologist-Dr. Gasper Sells over the phone-most of these tachycardias are not A. fib-there is a questionable 3-second strip where patient may have some irregularity-but not felt to have significant A. fib burden  to have caused her prior CVA in 2016.  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  Was maintained on aspirin-on hold due to GI bleeding.  Now has a PE-with plans to cautiously challenge with IV heparin.  GERD: Continue PPI  HTN: BP stable-was briefly hypotensive after syncopal episode on 11/27-and responded quickly to IVF.  Continue to hold antihypertensives.  HLD: Resume statin   Mood disorder: Resume Lexapro   4 mm right sided lung nodule: Will require outpatient follow-up with PCP.  Incidentally seen on CT angiogram of the chest.  BMI Estimated body mass index is 28.04 kg/m as calculated from the following:   Height as of this encounter: $RemoveBeforeD'5\' 6"'fNOehdyDfoJfVk$  (1.676 m).   Weight as of this encounter: 78.8 kg.    Procedures: None Consults: GI DVT Prophylaxis: SCD Code Status:Full code  Family Communication: None at bedside  Time spent: 35- minutes-Greater than 50% of this time was spent in counseling, explanation of diagnosis, planning of further management, and coordination of care.  Disposition Plan: Status is: Inpatient  Remains inpatient appropriate because: Diverticular bleeding with acute blood loss anemia-need to ensure stability for another 24 hours before consideration of discharge.    Diet: Diet Order             Diet regular Room service appropriate? Yes; Fluid consistency: Thin  Diet effective now                     Antimicrobial agents: Anti-infectives (From admission, onward)    None        MEDICATIONS: Scheduled Meds:  lidocaine  1 patch Transdermal Q24H   metoCLOPramide (REGLAN) injection  10 mg Intravenous Once   pantoprazole (PROTONIX) IV  40 mg Intravenous Q24H   Continuous Infusions:   PRN Meds:.acetaminophen **OR** acetaminophen, lip balm, ondansetron (ZOFRAN) IV   I have personally reviewed following labs and imaging studies  LABORATORY DATA: CBC: Recent  Labs  Lab 04/22/21 2117 04/23/21 0441 04/24/21 0740 04/24/21 1419 04/24/21 1545 04/25/21 0139 04/25/21 0713  WBC 12.1*   < > 9.6 10.4 13.2* 10.6* 10.1  NEUTROABS 10.0*  --   --   --   --   --   --   HGB 13.3   < > 10.4* 9.7* 10.2* 9.5* 9.8*  HCT 41.2   < > 32.7* 30.5* 32.4* 29.4* 30.5*  MCV 91.4   < > 93.2 94.4 94.2 93.0 93.0  PLT 241   < > 178 189 204 179 189   < > = values in this interval not displayed.     Basic Metabolic Panel: Recent Labs  Lab 04/23/21 0441 04/24/21 0235 04/24/21 0740 04/24/21 1419 04/25/21 0713  NA 137 137 136 135 138  K 3.4* 3.8 4.0 3.7 3.3*  CL 102 108 107 107 107  CO2 $Re'25 24 22 24 24  'uCc$ GLUCOSE 127* 110* 103* 119* 104*  BUN $Re'16 15 18 17 13  'Clf$ CREATININE 0.52 0.50 0.59 0.62 0.54  CALCIUM 8.2* 8.1* 8.1* 7.8* 8.3*  MG 2.1 2.1  --   --   --      GFR: Estimated Creatinine Clearance: 63.4 mL/min (by C-G formula based on SCr of 0.54 mg/dL).  Liver Function Tests: Recent Labs  Lab 04/22/21 2117 04/23/21 0441 04/24/21 0740 04/24/21 1419 04/25/21 0713  AST $Re'15 17 15 18 16  'JsQ$ ALT $R'14 17 12 14 15  'nE$ ALKPHOS 74 68 59 59 64  BILITOT 0.5 0.8 0.6 0.7 1.0  PROT 6.7 5.7* 5.3* 5.1* 5.3*  ALBUMIN 4.2 3.2* 2.9* 2.8* 2.9*    Recent Labs  Lab 04/22/21 2117  LIPASE 18    No results for input(s): AMMONIA in the last 168 hours.  Coagulation Profile: Recent Labs  Lab 04/23/21 0441  INR 1.0     Cardiac Enzymes: No results for input(s): CKTOTAL, CKMB, CKMBINDEX, TROPONINI in the last 168 hours.  BNP (last 3 results) No results for input(s): PROBNP in the last 8760 hours.  Lipid Profile: No results for input(s): CHOL, HDL, LDLCALC, TRIG, CHOLHDL, LDLDIRECT in the last 72 hours.  Thyroid Function Tests: No results for input(s): TSH, T4TOTAL, FREET4, T3FREE, THYROIDAB in the last 72 hours.  Anemia Panel: No results for input(s): VITAMINB12, FOLATE, FERRITIN, TIBC, IRON, RETICCTPCT in the last 72 hours.  Urine analysis: No results found for:  COLORURINE, APPEARANCEUR, LABSPEC, PHURINE, GLUCOSEU, HGBUR, BILIRUBINUR, KETONESUR, PROTEINUR, UROBILINOGEN, NITRITE, LEUKOCYTESUR  Sepsis Labs: Lactic Acid, Venous No results found for: LATICACIDVEN  MICROBIOLOGY: Recent Results (from the past 240 hour(s))  Resp Panel by RT-PCR (Flu A&B, Covid) Nasopharyngeal Swab     Status: None   Collection Time: 04/22/21 11:16 PM   Specimen: Nasopharyngeal Swab; Nasopharyngeal(NP) swabs in vial transport medium  Result Value Ref Range Status   SARS Coronavirus 2 by RT PCR NEGATIVE NEGATIVE Final    Comment: (NOTE) SARS-CoV-2 target nucleic acids are NOT DETECTED.  The SARS-CoV-2 RNA is generally detectable in upper respiratory specimens during the acute phase of infection. The lowest concentration of SARS-CoV-2 viral copies this assay can detect is 138 copies/mL. A negative result does not preclude SARS-Cov-2 infection and should not be used as the sole basis for treatment or other patient management decisions. A negative result may occur with  improper specimen collection/handling, submission of specimen other than nasopharyngeal swab, presence of viral mutation(s) within the areas targeted by this assay, and inadequate number of viral copies(<138 copies/mL). A negative result must be combined with clinical observations, patient history, and epidemiological information. The expected result is Negative.  Fact Sheet for Patients:  EntrepreneurPulse.com.au  Fact Sheet for Healthcare Providers:  IncredibleEmployment.be  This test is no t yet approved or cleared by the [place] FDA and  has been authorized for detection and/or diagnosis of SARS-CoV-2 by FDA under an Emergency Use Authorization (EUA). This EUA will remain  in effect (meaning this test can be used) for the duration of the COVID-19 declaration under Section 564(b)(1) of the Act, 21 U.S.C.section 360bbb-3(b)(1), unless the authorization is  terminated  or revoked sooner.       Influenza A by PCR NEGATIVE NEGATIVE Final   Influenza B by PCR NEGATIVE NEGATIVE Final    Comment: (NOTE) The Xpert Xpress SARS-CoV-2/FLU/RSV plus assay is intended  as an aid in the diagnosis of influenza from Nasopharyngeal swab specimens and should not be used as a sole basis for treatment. Nasal washings and aspirates are unacceptable for Xpert Xpress SARS-CoV-2/FLU/RSV testing.  Fact Sheet for Patients: EntrepreneurPulse.com.au  Fact Sheet for Healthcare Providers: IncredibleEmployment.be  This test is not yet approved or cleared by the [place] FDA and has been authorized for detection and/or diagnosis of SARS-CoV-2 by FDA under an Emergency Use Authorization (EUA). This EUA will remain in effect (meaning this test can be used) for the duration of the COVID-19 declaration under Section 564(b)(1) of the Act, 21 U.S.C. section 360bbb-3(b)(1), unless the authorization is terminated or revoked.  Performed at [facility], [address], [place], [place] [address]   MRSA Next Gen by PCR, Nasal     Status: None   Collection Time: 04/24/21  6:34 PM   Specimen: Nasal Mucosa; Nasal Swab  Result Value Ref Range Status   MRSA by PCR Next Gen NOT DETECTED NOT DETECTED Final    Comment: (NOTE) The GeneXpert MRSA Assay (FDA approved for NASAL specimens only), is one component of a comprehensive MRSA colonization surveillance program. It is not intended to diagnose MRSA infection nor to guide or monitor treatment for MRSA infections. Test performance is not FDA approved in patients less than 64 years old. Performed at [facility], [place] [address], [place], [place] [address]     RADIOLOGY STUDIES/RESULTS: DG Ribs Unilateral Left  Result Date: 04/24/2021 CLINICAL DATA:  CPR, rib pain. EXAM: LEFT RIBS - 2 VIEW COMPARISON:  None. FINDINGS: The lungs are clear. There is no  pleural effusion or pneumothorax. The cardiomediastinal silhouette is within normal limits. There are acute nondisplaced left anterior fifth, sixth and seventh rib fractures. No displaced rib fractures are identified. IMPRESSION: 1. Acute nondisplaced left fifth, sixth and seventh rib fractures. 2. No other acute cardiopulmonary process. Electronically Signed   By: Ronney Asters M.D.   On: 04/24/2021 21:16   DG Ribs Unilateral Right  Result Date: 04/24/2021 CLINICAL DATA:  Pt complains of bilateral rib pain. Pt states she received a single compression from CPR and that is when the pain started. No other injury noted. EXAM: RIGHT RIBS - 2 VIEW COMPARISON:  None. FINDINGS: No acute displaced fracture or other bone lesions are seen involving the right ribs. IMPRESSION: No acute displaced rib fracture. Please note, nondisplaced rib fractures may be occult on radiograph. Electronically Signed   By: Iven Finn M.D.   On: 04/24/2021 22:20   CT Angio Chest Pulmonary Embolism (PE) W or WO Contrast  Result Date: 04/25/2021 CLINICAL DATA:  PE suspected.  Low to intermediate probability. EXAM: CT ANGIOGRAPHY CHEST WITH CONTRAST TECHNIQUE: Multidetector CT imaging of the chest was performed using the standard protocol during bolus administration of intravenous contrast. Multiplanar CT image reconstructions and MIPs were obtained to evaluate the vascular anatomy. CONTRAST:  168mL OMNIPAQUE IOHEXOL 350 MG/ML SOLN COMPARISON:  Plain films of the ribs of 1 day prior. FINDINGS: Cardiovascular: The quality of this exam for evaluation of pulmonary embolism is sufficient. The bolus is suboptimally timed and there is minimal motion degradation. Right lower lobe segmental, nearly occlusive thrombus including on 309/8 and 92/9. Aortic atherosclerosis. Tortuous thoracic aorta. Normal heart size, without pericardial effusion. Lad coronary artery calcification. Mediastinum/Nodes: No mediastinal or hilar adenopathy. Contrast air  level in the esophagus on 93/6. Lungs/Pleura: Trace right pleural fluid. 3 mm nodule on the right minor fissure on 78/7. 3-4  mm right middle lobe pulmonary nodule on 99/7. Upper Abdomen: Hepatic right hepatic lobe cysts. Normal imaged portions of the spleen, stomach, left adrenal gland. Musculoskeletal: No acute osseous abnormality. Review of the MIP images confirms the above findings. IMPRESSION: 1. Isolated right lower lobe segmental pulmonary embolism. 2. Trace right pleural fluid. 3. Coronary artery atherosclerosis. Aortic Atherosclerosis (ICD10-I70.0). 4. Esophageal air fluid level suggests dysmotility or gastroesophageal reflux. 5. Right-sided pulmonary nodules of up to 4 mm. No follow-up needed if patient is low-risk. Non-contrast chest CT can be considered in 12 months if patient is high-risk. This recommendation follows the consensus statement: Guidelines for Management of Incidental Pulmonary Nodules Detected on CT Images: From the Fleischner Society 2017; Radiology 2017; 284:228-243. 1. These results will be called to the ordering clinician or representative by the Radiologist Assistant, and communication documented in the PACS or [employer]. Electronically Signed   By: Abigail Miyamoto M.D.   On: 04/25/2021 11:37   IR Angiogram Visceral Selective  Result Date: 04/24/2021 INDICATION: Acute lower GI bleeding. Positive CTA for active diverticular bleeding within the distal descending/proximal sigmoid colon. Please perform mesenteric arteriogram and percutaneous embolization as indicated. EXAM: 1. ULTRASOUND GUIDANCE FOR ARTERIAL ACCESS 2. SELECTIVE SUPERIOR MESENTERIC ARTERIOGRAM 3. FLUSH AORTOGRAM 4. SELECTIVE INFERIOR MESENTERIC ARTERIOGRAM 5. COMPARISON:  CTA abdomen and pelvis - earlier same day MEDICATIONS: None ANESTHESIA/SEDATION: Moderate (conscious) sedation was employed during this procedure as administered by the Interventional Radiology RN. A total of Versed 4 mg and Fentanyl 100 mcg was  administered intravenously. Moderate Sedation Time: 60 minutes. The patient's level of consciousness and vital signs were monitored continuously by radiology nursing throughout the procedure under my direct supervision. CONTRAST:  60 cc Omnipaque 300 FLUOROSCOPY TIME:  19.6 minutes (970 mGy) COMPLICATIONS: None immediate. PROCEDURE: Informed consent was obtained from the patient following explanation of the procedure, risks, benefits and alternatives. All questions were addressed. A time out was performed prior to the initiation of the procedure. Maximal barrier sterile technique utilized including caps, mask, sterile gowns, sterile gloves, large sterile drape, hand hygiene, and Betadine prep. The right femoral head was marked fluoroscopically. Under sterile conditions and local anesthesia, the right common femoral artery access was performed with a micropuncture needle. Under direct ultrasound guidance, the right common femoral was accessed with a micropuncture kit. An ultrasound image was saved for documentation purposes. This allowed for placement of a 5-French vascular sheath. A limited arteriogram was performed through the side arm of the sheath confirming appropriate access within the right common femoral artery. Over a Bentson wire, a Mickelson catheter was advanced the caudal aspect of the thoracic aorta where was reformed, back bled and flushed. The Mickelson catheter was then utilized to select the superior mesenteric artery and a selective superior mesenteric arteriogram was performed. Next, the Mickelson catheter was utilized to attempt to cannulate the inferior mesenteric artery however this proved challenging given tortuosity of the abdominal aorta. As such, over Bentson wire, the Mickelson catheter was exchanged for an Omni Flush catheter and a flush abdominal aortogram was performed demarcating the origin of the IMA. Next, with the use of a 5 French angled glide catheter, the IMA was selected and a  selective inferior mesenteric arteriogram was performed. With the use of a fathom 14 microwire, a regular Renegade microcatheter was advanced to the level of the trifurcation of the IMA and a sub selective inferior mesenteric arteriogram was performed. The microcatheter was then advanced to select the sigmoidal artery and a selective sigmoidal arteriogram  was performed. The microcatheter was advanced into the distal arcade of the sigmoidal artery, beyond the location of the distal tributaries supplying the ill-defined area of contrast extravasation within the distal descending/proximal sigmoid colon. Contrast injection confirmed appropriate positioning and the short-segment of the distal arcade was percutaneously coil embolized with overlapping 2 mm diameter interlock coils. The microcatheter was retracted to the level of the sigmoidal artery and a post embolization sigmoidal arteriogram was performed. The microcatheter was then utilized to select the left colic artery and a selective left colic arteriogram was performed Images were reviewed and the procedure was terminated. All wires, catheters and sheaths were removed from the patient. Hemostasis was achieved at the right groin access site with manual compression. The patient tolerated the procedure well without immediate post procedural complication. FINDINGS: Selective inferior mesenteric arteriogram demonstrates a conventional branching pattern and is negative for significant arterial supply to the distal descending/proximal sigmoid colon. No discrete areas of vessel irregularity or contrast extravasation are identified. Flush abdominal aortogram demonstrates patency of the IMA. The abdominal aorta is noted to be tortuous but of normal caliber as are the bilateral common and internal iliac arteries. Selective inferior mesenteric arteriogram demonstrates a conventional branching pattern. A ill-defined area of contrast extravasation is seen at the level of the  distal descending/proximal sigmoid colon, correlating with the findings on preceding CTA. Dominant arterial supply to this area of contrast extravasation is via the sigmoidal artery. Sub selective sigmoidal arteriogram confirms an ill-defined area of contrast extravasation supplied via a tiny tertiary branch from the distal arcade of the sigmoidal artery. The distal arcade was percutaneously coil embolized across the origin of the tiny tertiary branch supplying the ill-defined area of contrast extravasation. Post embolization selective arteriograms performed both from the level of the sigmoidal and left colic arteries are negative for persistent active extravasation. IMPRESSION: Technically successful percutaneous coil embolization of a distal arcade of the sigmoidal artery across the origin of a tiny tertiary branch supplying an ill-defined area of contrast extravasation at the level of the distal descending/proximal sigmoid colon, correlating with the findings seen on preceding CTA. PLAN: - The patient is to remain flat for 4 hours with right leg straight. - The patient will continue to experience several additional bloody bowel movements and may continue to require additional resuscitation (as she was bleeding both before and during the procedure), however ultimately I am hopeful she will stabilize in the coming days. - While presumably secondary to diverticular disease, repeat colonoscopy after the resolution of acute symptoms is advised to exclude the presence of an underlying mass/lesion. Electronically Signed   By: Sandi Mariscal M.D.   On: 04/24/2021 08:53   IR Angiogram Visceral Selective  Result Date: 04/24/2021 INDICATION: Acute lower GI bleeding. Positive CTA for active diverticular bleeding within the distal descending/proximal sigmoid colon. Please perform mesenteric arteriogram and percutaneous embolization as indicated. EXAM: 1. ULTRASOUND GUIDANCE FOR ARTERIAL ACCESS 2. SELECTIVE SUPERIOR  MESENTERIC ARTERIOGRAM 3. FLUSH AORTOGRAM 4. SELECTIVE INFERIOR MESENTERIC ARTERIOGRAM 5. COMPARISON:  CTA abdomen and pelvis - earlier same day MEDICATIONS: None ANESTHESIA/SEDATION: Moderate (conscious) sedation was employed during this procedure as administered by the Interventional Radiology RN. A total of Versed 4 mg and Fentanyl 100 mcg was administered intravenously. Moderate Sedation Time: 60 minutes. The patient's level of consciousness and vital signs were monitored continuously by radiology nursing throughout the procedure under my direct supervision. CONTRAST:  60 cc Omnipaque 300 FLUOROSCOPY TIME:  19.6 minutes (829 mGy) COMPLICATIONS: None immediate. PROCEDURE: Informed  consent was obtained from the patient following explanation of the procedure, risks, benefits and alternatives. All questions were addressed. A time out was performed prior to the initiation of the procedure. Maximal barrier sterile technique utilized including caps, mask, sterile gowns, sterile gloves, large sterile drape, hand hygiene, and Betadine prep. The right femoral head was marked fluoroscopically. Under sterile conditions and local anesthesia, the right common femoral artery access was performed with a micropuncture needle. Under direct ultrasound guidance, the right common femoral was accessed with a micropuncture kit. An ultrasound image was saved for documentation purposes. This allowed for placement of a 5-French vascular sheath. A limited arteriogram was performed through the side arm of the sheath confirming appropriate access within the right common femoral artery. Over a Bentson wire, a Mickelson catheter was advanced the caudal aspect of the thoracic aorta where was reformed, back bled and flushed. The Mickelson catheter was then utilized to select the superior mesenteric artery and a selective superior mesenteric arteriogram was performed. Next, the Mickelson catheter was utilized to attempt to cannulate the inferior  mesenteric artery however this proved challenging given tortuosity of the abdominal aorta. As such, over Bentson wire, the Mickelson catheter was exchanged for an Omni Flush catheter and a flush abdominal aortogram was performed demarcating the origin of the IMA. Next, with the use of a 5 French angled glide catheter, the IMA was selected and a selective inferior mesenteric arteriogram was performed. With the use of a fathom 14 microwire, a regular Renegade microcatheter was advanced to the level of the trifurcation of the IMA and a sub selective inferior mesenteric arteriogram was performed. The microcatheter was then advanced to select the sigmoidal artery and a selective sigmoidal arteriogram was performed. The microcatheter was advanced into the distal arcade of the sigmoidal artery, beyond the location of the distal tributaries supplying the ill-defined area of contrast extravasation within the distal descending/proximal sigmoid colon. Contrast injection confirmed appropriate positioning and the short-segment of the distal arcade was percutaneously coil embolized with overlapping 2 mm diameter interlock coils. The microcatheter was retracted to the level of the sigmoidal artery and a post embolization sigmoidal arteriogram was performed. The microcatheter was then utilized to select the left colic artery and a selective left colic arteriogram was performed Images were reviewed and the procedure was terminated. All wires, catheters and sheaths were removed from the patient. Hemostasis was achieved at the right groin access site with manual compression. The patient tolerated the procedure well without immediate post procedural complication. FINDINGS: Selective inferior mesenteric arteriogram demonstrates a conventional branching pattern and is negative for significant arterial supply to the distal descending/proximal sigmoid colon. No discrete areas of vessel irregularity or contrast extravasation are identified.  Flush abdominal aortogram demonstrates patency of the IMA. The abdominal aorta is noted to be tortuous but of normal caliber as are the bilateral common and internal iliac arteries. Selective inferior mesenteric arteriogram demonstrates a conventional branching pattern. A ill-defined area of contrast extravasation is seen at the level of the distal descending/proximal sigmoid colon, correlating with the findings on preceding CTA. Dominant arterial supply to this area of contrast extravasation is via the sigmoidal artery. Sub selective sigmoidal arteriogram confirms an ill-defined area of contrast extravasation supplied via a tiny tertiary branch from the distal arcade of the sigmoidal artery. The distal arcade was percutaneously coil embolized across the origin of the tiny tertiary branch supplying the ill-defined area of contrast extravasation. Post embolization selective arteriograms performed both from the level of the sigmoidal  and left colic arteries are negative for persistent active extravasation. IMPRESSION: Technically successful percutaneous coil embolization of a distal arcade of the sigmoidal artery across the origin of a tiny tertiary branch supplying an ill-defined area of contrast extravasation at the level of the distal descending/proximal sigmoid colon, correlating with the findings seen on preceding CTA. PLAN: - The patient is to remain flat for 4 hours with right leg straight. - The patient will continue to experience several additional bloody bowel movements and may continue to require additional resuscitation (as she was bleeding both before and during the procedure), however ultimately I am hopeful she will stabilize in the coming days. - While presumably secondary to diverticular disease, repeat colonoscopy after the resolution of acute symptoms is advised to exclude the presence of an underlying mass/lesion. Electronically Signed   By: Sandi Mariscal M.D.   On: 04/24/2021 08:53   IR Angiogram  Selective Each Additional Vessel  Result Date: 04/24/2021 INDICATION: Acute lower GI bleeding. Positive CTA for active diverticular bleeding within the distal descending/proximal sigmoid colon. Please perform mesenteric arteriogram and percutaneous embolization as indicated. EXAM: 1. ULTRASOUND GUIDANCE FOR ARTERIAL ACCESS 2. SELECTIVE SUPERIOR MESENTERIC ARTERIOGRAM 3. FLUSH AORTOGRAM 4. SELECTIVE INFERIOR MESENTERIC ARTERIOGRAM 5. COMPARISON:  CTA abdomen and pelvis - earlier same day MEDICATIONS: None ANESTHESIA/SEDATION: Moderate (conscious) sedation was employed during this procedure as administered by the Interventional Radiology RN. A total of Versed 4 mg and Fentanyl 100 mcg was administered intravenously. Moderate Sedation Time: 60 minutes. The patient's level of consciousness and vital signs were monitored continuously by radiology nursing throughout the procedure under my direct supervision. CONTRAST:  60 cc Omnipaque 300 FLUOROSCOPY TIME:  19.6 minutes (250 mGy) COMPLICATIONS: None immediate. PROCEDURE: Informed consent was obtained from the patient following explanation of the procedure, risks, benefits and alternatives. All questions were addressed. A time out was performed prior to the initiation of the procedure. Maximal barrier sterile technique utilized including caps, mask, sterile gowns, sterile gloves, large sterile drape, hand hygiene, and Betadine prep. The right femoral head was marked fluoroscopically. Under sterile conditions and local anesthesia, the right common femoral artery access was performed with a micropuncture needle. Under direct ultrasound guidance, the right common femoral was accessed with a micropuncture kit. An ultrasound image was saved for documentation purposes. This allowed for placement of a 5-French vascular sheath. A limited arteriogram was performed through the side arm of the sheath confirming appropriate access within the right common femoral artery. Over a  Bentson wire, a Mickelson catheter was advanced the caudal aspect of the thoracic aorta where was reformed, back bled and flushed. The Mickelson catheter was then utilized to select the superior mesenteric artery and a selective superior mesenteric arteriogram was performed. Next, the Mickelson catheter was utilized to attempt to cannulate the inferior mesenteric artery however this proved challenging given tortuosity of the abdominal aorta. As such, over Bentson wire, the Mickelson catheter was exchanged for an Omni Flush catheter and a flush abdominal aortogram was performed demarcating the origin of the IMA. Next, with the use of a 5 French angled glide catheter, the IMA was selected and a selective inferior mesenteric arteriogram was performed. With the use of a fathom 14 microwire, a regular Renegade microcatheter was advanced to the level of the trifurcation of the IMA and a sub selective inferior mesenteric arteriogram was performed. The microcatheter was then advanced to select the sigmoidal artery and a selective sigmoidal arteriogram was performed. The microcatheter was advanced into the distal arcade  of the sigmoidal artery, beyond the location of the distal tributaries supplying the ill-defined area of contrast extravasation within the distal descending/proximal sigmoid colon. Contrast injection confirmed appropriate positioning and the short-segment of the distal arcade was percutaneously coil embolized with overlapping 2 mm diameter interlock coils. The microcatheter was retracted to the level of the sigmoidal artery and a post embolization sigmoidal arteriogram was performed. The microcatheter was then utilized to select the left colic artery and a selective left colic arteriogram was performed Images were reviewed and the procedure was terminated. All wires, catheters and sheaths were removed from the patient. Hemostasis was achieved at the right groin access site with manual compression. The patient  tolerated the procedure well without immediate post procedural complication. FINDINGS: Selective inferior mesenteric arteriogram demonstrates a conventional branching pattern and is negative for significant arterial supply to the distal descending/proximal sigmoid colon. No discrete areas of vessel irregularity or contrast extravasation are identified. Flush abdominal aortogram demonstrates patency of the IMA. The abdominal aorta is noted to be tortuous but of normal caliber as are the bilateral common and internal iliac arteries. Selective inferior mesenteric arteriogram demonstrates a conventional branching pattern. A ill-defined area of contrast extravasation is seen at the level of the distal descending/proximal sigmoid colon, correlating with the findings on preceding CTA. Dominant arterial supply to this area of contrast extravasation is via the sigmoidal artery. Sub selective sigmoidal arteriogram confirms an ill-defined area of contrast extravasation supplied via a tiny tertiary branch from the distal arcade of the sigmoidal artery. The distal arcade was percutaneously coil embolized across the origin of the tiny tertiary branch supplying the ill-defined area of contrast extravasation. Post embolization selective arteriograms performed both from the level of the sigmoidal and left colic arteries are negative for persistent active extravasation. IMPRESSION: Technically successful percutaneous coil embolization of a distal arcade of the sigmoidal artery across the origin of a tiny tertiary branch supplying an ill-defined area of contrast extravasation at the level of the distal descending/proximal sigmoid colon, correlating with the findings seen on preceding CTA. PLAN: - The patient is to remain flat for 4 hours with right leg straight. - The patient will continue to experience several additional bloody bowel movements and may continue to require additional resuscitation (as she was bleeding both before and  during the procedure), however ultimately I am hopeful she will stabilize in the coming days. - While presumably secondary to diverticular disease, repeat colonoscopy after the resolution of acute symptoms is advised to exclude the presence of an underlying mass/lesion. Electronically Signed   By: Sandi Mariscal M.D.   On: 04/24/2021 08:53   IR Angiogram Selective Each Additional Vessel  Result Date: 04/24/2021 INDICATION: Acute lower GI bleeding. Positive CTA for active diverticular bleeding within the distal descending/proximal sigmoid colon. Please perform mesenteric arteriogram and percutaneous embolization as indicated. EXAM: 1. ULTRASOUND GUIDANCE FOR ARTERIAL ACCESS 2. SELECTIVE SUPERIOR MESENTERIC ARTERIOGRAM 3. FLUSH AORTOGRAM 4. SELECTIVE INFERIOR MESENTERIC ARTERIOGRAM 5. COMPARISON:  CTA abdomen and pelvis - earlier same day MEDICATIONS: None ANESTHESIA/SEDATION: Moderate (conscious) sedation was employed during this procedure as administered by the Interventional Radiology RN. A total of Versed 4 mg and Fentanyl 100 mcg was administered intravenously. Moderate Sedation Time: 60 minutes. The patient's level of consciousness and vital signs were monitored continuously by radiology nursing throughout the procedure under my direct supervision. CONTRAST:  60 cc Omnipaque 300 FLUOROSCOPY TIME:  19.6 minutes (440 mGy) COMPLICATIONS: None immediate. PROCEDURE: Informed consent was obtained from the patient following explanation  of the procedure, risks, benefits and alternatives. All questions were addressed. A time out was performed prior to the initiation of the procedure. Maximal barrier sterile technique utilized including caps, mask, sterile gowns, sterile gloves, large sterile drape, hand hygiene, and Betadine prep. The right femoral head was marked fluoroscopically. Under sterile conditions and local anesthesia, the right common femoral artery access was performed with a micropuncture needle. Under  direct ultrasound guidance, the right common femoral was accessed with a micropuncture kit. An ultrasound image was saved for documentation purposes. This allowed for placement of a 5-French vascular sheath. A limited arteriogram was performed through the side arm of the sheath confirming appropriate access within the right common femoral artery. Over a Bentson wire, a Mickelson catheter was advanced the caudal aspect of the thoracic aorta where was reformed, back bled and flushed. The Mickelson catheter was then utilized to select the superior mesenteric artery and a selective superior mesenteric arteriogram was performed. Next, the Mickelson catheter was utilized to attempt to cannulate the inferior mesenteric artery however this proved challenging given tortuosity of the abdominal aorta. As such, over Bentson wire, the Mickelson catheter was exchanged for an Omni Flush catheter and a flush abdominal aortogram was performed demarcating the origin of the IMA. Next, with the use of a 5 French angled glide catheter, the IMA was selected and a selective inferior mesenteric arteriogram was performed. With the use of a fathom 14 microwire, a regular Renegade microcatheter was advanced to the level of the trifurcation of the IMA and a sub selective inferior mesenteric arteriogram was performed. The microcatheter was then advanced to select the sigmoidal artery and a selective sigmoidal arteriogram was performed. The microcatheter was advanced into the distal arcade of the sigmoidal artery, beyond the location of the distal tributaries supplying the ill-defined area of contrast extravasation within the distal descending/proximal sigmoid colon. Contrast injection confirmed appropriate positioning and the short-segment of the distal arcade was percutaneously coil embolized with overlapping 2 mm diameter interlock coils. The microcatheter was retracted to the level of the sigmoidal artery and a post embolization sigmoidal  arteriogram was performed. The microcatheter was then utilized to select the left colic artery and a selective left colic arteriogram was performed Images were reviewed and the procedure was terminated. All wires, catheters and sheaths were removed from the patient. Hemostasis was achieved at the right groin access site with manual compression. The patient tolerated the procedure well without immediate post procedural complication. FINDINGS: Selective inferior mesenteric arteriogram demonstrates a conventional branching pattern and is negative for significant arterial supply to the distal descending/proximal sigmoid colon. No discrete areas of vessel irregularity or contrast extravasation are identified. Flush abdominal aortogram demonstrates patency of the IMA. The abdominal aorta is noted to be tortuous but of normal caliber as are the bilateral common and internal iliac arteries. Selective inferior mesenteric arteriogram demonstrates a conventional branching pattern. A ill-defined area of contrast extravasation is seen at the level of the distal descending/proximal sigmoid colon, correlating with the findings on preceding CTA. Dominant arterial supply to this area of contrast extravasation is via the sigmoidal artery. Sub selective sigmoidal arteriogram confirms an ill-defined area of contrast extravasation supplied via a tiny tertiary branch from the distal arcade of the sigmoidal artery. The distal arcade was percutaneously coil embolized across the origin of the tiny tertiary branch supplying the ill-defined area of contrast extravasation. Post embolization selective arteriograms performed both from the level of the sigmoidal and left colic arteries are negative for persistent  active extravasation. IMPRESSION: Technically successful percutaneous coil embolization of a distal arcade of the sigmoidal artery across the origin of a tiny tertiary branch supplying an ill-defined area of contrast extravasation at the  level of the distal descending/proximal sigmoid colon, correlating with the findings seen on preceding CTA. PLAN: - The patient is to remain flat for 4 hours with right leg straight. - The patient will continue to experience several additional bloody bowel movements and may continue to require additional resuscitation (as she was bleeding both before and during the procedure), however ultimately I am hopeful she will stabilize in the coming days. - While presumably secondary to diverticular disease, repeat colonoscopy after the resolution of acute symptoms is advised to exclude the presence of an underlying mass/lesion. Electronically Signed   By: Sandi Mariscal M.D.   On: 04/24/2021 08:53   IR US Guide Vasc Access Right  Result Date: 04/24/2021 INDICATION: Acute lower GI bleeding. Positive CTA for active diverticular bleeding within the distal descending/proximal sigmoid colon. Please perform mesenteric arteriogram and percutaneous embolization as indicated. EXAM: 1. ULTRASOUND GUIDANCE FOR ARTERIAL ACCESS 2. SELECTIVE SUPERIOR MESENTERIC ARTERIOGRAM 3. FLUSH AORTOGRAM 4. SELECTIVE INFERIOR MESENTERIC ARTERIOGRAM 5. COMPARISON:  CTA abdomen and pelvis - earlier same day MEDICATIONS: None ANESTHESIA/SEDATION: Moderate (conscious) sedation was employed during this procedure as administered by the Interventional Radiology RN. A total of Versed 4 mg and Fentanyl 100 mcg was administered intravenously. Moderate Sedation Time: 60 minutes. The patient's level of consciousness and vital signs were monitored continuously by radiology nursing throughout the procedure under my direct supervision. CONTRAST:  60 cc Omnipaque 300 FLUOROSCOPY TIME:  19.6 minutes (845 mGy) COMPLICATIONS: None immediate. PROCEDURE: Informed consent was obtained from the patient following explanation of the procedure, risks, benefits and alternatives. All questions were addressed. A time out was performed prior to the initiation of the procedure.  Maximal barrier sterile technique utilized including caps, mask, sterile gowns, sterile gloves, large sterile drape, hand hygiene, and Betadine prep. The right femoral head was marked fluoroscopically. Under sterile conditions and local anesthesia, the right common femoral artery access was performed with a micropuncture needle. Under direct ultrasound guidance, the right common femoral was accessed with a micropuncture kit. An ultrasound image was saved for documentation purposes. This allowed for placement of a 5-French vascular sheath. A limited arteriogram was performed through the side arm of the sheath confirming appropriate access within the right common femoral artery. Over a Bentson wire, a Mickelson catheter was advanced the caudal aspect of the thoracic aorta where was reformed, back bled and flushed. The Mickelson catheter was then utilized to select the superior mesenteric artery and a selective superior mesenteric arteriogram was performed. Next, the Mickelson catheter was utilized to attempt to cannulate the inferior mesenteric artery however this proved challenging given tortuosity of the abdominal aorta. As such, over Bentson wire, the Mickelson catheter was exchanged for an Omni Flush catheter and a flush abdominal aortogram was performed demarcating the origin of the IMA. Next, with the use of a 5 French angled glide catheter, the IMA was selected and a selective inferior mesenteric arteriogram was performed. With the use of a fathom 14 microwire, a regular Renegade microcatheter was advanced to the level of the trifurcation of the IMA and a sub selective inferior mesenteric arteriogram was performed. The microcatheter was then advanced to select the sigmoidal artery and a selective sigmoidal arteriogram was performed. The microcatheter was advanced into the distal arcade of the sigmoidal artery, beyond the location of  the distal tributaries supplying the ill-defined area of contrast extravasation  within the distal descending/proximal sigmoid colon. Contrast injection confirmed appropriate positioning and the short-segment of the distal arcade was percutaneously coil embolized with overlapping 2 mm diameter interlock coils. The microcatheter was retracted to the level of the sigmoidal artery and a post embolization sigmoidal arteriogram was performed. The microcatheter was then utilized to select the left colic artery and a selective left colic arteriogram was performed Images were reviewed and the procedure was terminated. All wires, catheters and sheaths were removed from the patient. Hemostasis was achieved at the right groin access site with manual compression. The patient tolerated the procedure well without immediate post procedural complication. FINDINGS: Selective inferior mesenteric arteriogram demonstrates a conventional branching pattern and is negative for significant arterial supply to the distal descending/proximal sigmoid colon. No discrete areas of vessel irregularity or contrast extravasation are identified. Flush abdominal aortogram demonstrates patency of the IMA. The abdominal aorta is noted to be tortuous but of normal caliber as are the bilateral common and internal iliac arteries. Selective inferior mesenteric arteriogram demonstrates a conventional branching pattern. A ill-defined area of contrast extravasation is seen at the level of the distal descending/proximal sigmoid colon, correlating with the findings on preceding CTA. Dominant arterial supply to this area of contrast extravasation is via the sigmoidal artery. Sub selective sigmoidal arteriogram confirms an ill-defined area of contrast extravasation supplied via a tiny tertiary branch from the distal arcade of the sigmoidal artery. The distal arcade was percutaneously coil embolized across the origin of the tiny tertiary branch supplying the ill-defined area of contrast extravasation. Post embolization selective arteriograms  performed both from the level of the sigmoidal and left colic arteries are negative for persistent active extravasation. IMPRESSION: Technically successful percutaneous coil embolization of a distal arcade of the sigmoidal artery across the origin of a tiny tertiary branch supplying an ill-defined area of contrast extravasation at the level of the distal descending/proximal sigmoid colon, correlating with the findings seen on preceding CTA. PLAN: - The patient is to remain flat for 4 hours with right leg straight. - The patient will continue to experience several additional bloody bowel movements and may continue to require additional resuscitation (as she was bleeding both before and during the procedure), however ultimately I am hopeful she will stabilize in the coming days. - While presumably secondary to diverticular disease, repeat colonoscopy after the resolution of acute symptoms is advised to exclude the presence of an underlying mass/lesion. Electronically Signed   By: Sandi Mariscal M.D.   On: 04/24/2021 08:53   IR EMBO ARTERIAL NOT HEMORR HEMANG INC GUIDE ROADMAPPING  Result Date: 04/24/2021 INDICATION: Acute lower GI bleeding. Positive CTA for active diverticular bleeding within the distal descending/proximal sigmoid colon. Please perform mesenteric arteriogram and percutaneous embolization as indicated. EXAM: 1. ULTRASOUND GUIDANCE FOR ARTERIAL ACCESS 2. SELECTIVE SUPERIOR MESENTERIC ARTERIOGRAM 3. FLUSH AORTOGRAM 4. SELECTIVE INFERIOR MESENTERIC ARTERIOGRAM 5. COMPARISON:  CTA abdomen and pelvis - earlier same day MEDICATIONS: None ANESTHESIA/SEDATION: Moderate (conscious) sedation was employed during this procedure as administered by the Interventional Radiology RN. A total of Versed 4 mg and Fentanyl 100 mcg was administered intravenously. Moderate Sedation Time: 60 minutes. The patient's level of consciousness and vital signs were monitored continuously by radiology nursing throughout the  procedure under my direct supervision. CONTRAST:  60 cc Omnipaque 300 FLUOROSCOPY TIME:  19.6 minutes (426 mGy) COMPLICATIONS: None immediate. PROCEDURE: Informed consent was obtained from the patient following explanation of the procedure, risks,  benefits and alternatives. All questions were addressed. A time out was performed prior to the initiation of the procedure. Maximal barrier sterile technique utilized including caps, mask, sterile gowns, sterile gloves, large sterile drape, hand hygiene, and Betadine prep. The right femoral head was marked fluoroscopically. Under sterile conditions and local anesthesia, the right common femoral artery access was performed with a micropuncture needle. Under direct ultrasound guidance, the right common femoral was accessed with a micropuncture kit. An ultrasound image was saved for documentation purposes. This allowed for placement of a 5-French vascular sheath. A limited arteriogram was performed through the side arm of the sheath confirming appropriate access within the right common femoral artery. Over a Bentson wire, a Mickelson catheter was advanced the caudal aspect of the thoracic aorta where was reformed, back bled and flushed. The Mickelson catheter was then utilized to select the superior mesenteric artery and a selective superior mesenteric arteriogram was performed. Next, the Mickelson catheter was utilized to attempt to cannulate the inferior mesenteric artery however this proved challenging given tortuosity of the abdominal aorta. As such, over Bentson wire, the Mickelson catheter was exchanged for an Omni Flush catheter and a flush abdominal aortogram was performed demarcating the origin of the IMA. Next, with the use of a 5 French angled glide catheter, the IMA was selected and a selective inferior mesenteric arteriogram was performed. With the use of a fathom 14 microwire, a regular Renegade microcatheter was advanced to the level of the trifurcation of the  IMA and a sub selective inferior mesenteric arteriogram was performed. The microcatheter was then advanced to select the sigmoidal artery and a selective sigmoidal arteriogram was performed. The microcatheter was advanced into the distal arcade of the sigmoidal artery, beyond the location of the distal tributaries supplying the ill-defined area of contrast extravasation within the distal descending/proximal sigmoid colon. Contrast injection confirmed appropriate positioning and the short-segment of the distal arcade was percutaneously coil embolized with overlapping 2 mm diameter interlock coils. The microcatheter was retracted to the level of the sigmoidal artery and a post embolization sigmoidal arteriogram was performed. The microcatheter was then utilized to select the left colic artery and a selective left colic arteriogram was performed Images were reviewed and the procedure was terminated. All wires, catheters and sheaths were removed from the patient. Hemostasis was achieved at the right groin access site with manual compression. The patient tolerated the procedure well without immediate post procedural complication. FINDINGS: Selective inferior mesenteric arteriogram demonstrates a conventional branching pattern and is negative for significant arterial supply to the distal descending/proximal sigmoid colon. No discrete areas of vessel irregularity or contrast extravasation are identified. Flush abdominal aortogram demonstrates patency of the IMA. The abdominal aorta is noted to be tortuous but of normal caliber as are the bilateral common and internal iliac arteries. Selective inferior mesenteric arteriogram demonstrates a conventional branching pattern. A ill-defined area of contrast extravasation is seen at the level of the distal descending/proximal sigmoid colon, correlating with the findings on preceding CTA. Dominant arterial supply to this area of contrast extravasation is via the sigmoidal artery.  Sub selective sigmoidal arteriogram confirms an ill-defined area of contrast extravasation supplied via a tiny tertiary branch from the distal arcade of the sigmoidal artery. The distal arcade was percutaneously coil embolized across the origin of the tiny tertiary branch supplying the ill-defined area of contrast extravasation. Post embolization selective arteriograms performed both from the level of the sigmoidal and left colic arteries are negative for persistent active extravasation. IMPRESSION: Technically  successful percutaneous coil embolization of a distal arcade of the sigmoidal artery across the origin of a tiny tertiary branch supplying an ill-defined area of contrast extravasation at the level of the distal descending/proximal sigmoid colon, correlating with the findings seen on preceding CTA. PLAN: - The patient is to remain flat for 4 hours with right leg straight. - The patient will continue to experience several additional bloody bowel movements and may continue to require additional resuscitation (as she was bleeding both before and during the procedure), however ultimately I am hopeful she will stabilize in the coming days. - While presumably secondary to diverticular disease, repeat colonoscopy after the resolution of acute symptoms is advised to exclude the presence of an underlying mass/lesion. Electronically Signed   By: Sandi Mariscal M.D.   On: 04/24/2021 08:53   CT Angio Abd/Pel w/ and/or w/o  Result Date: 04/23/2021 CLINICAL DATA:  GI bleed, bright red blood per rectum EXAM: CTA ABDOMEN AND PELVIS WITHOUT AND WITH CONTRAST TECHNIQUE: Multidetector CT imaging of the abdomen and pelvis was performed using the standard protocol during bolus administration of intravenous contrast. Multiplanar reconstructed images and MIPs were obtained and reviewed to evaluate the vascular anatomy. CONTRAST:  173mL OMNIPAQUE IOHEXOL 350 MG/ML SOLN IV COMPARISON:  None FINDINGS: VASCULAR Aorta: Atherosclerotic  calcifications of tortuous abdominal aorta without aneurysm. No intramural hematoma on precontrast imaging. No dissection. No significant thrombus. Celiac: Mild plaque at origin.  No significant stenosis. SMA: Widely patent Renals: Plaque formation at BILATERAL renal artery origins, not significant on LEFT, estimated 50% on RIGHT. IMA: Patent origin Inflow: Atherosclerotic calcifications and tortuosity of common iliac arteries. Additional calcified plaque within internal iliac arteries bilaterally. Proximal Outflow: Mild plaque at common femoral arteries and bifurcations Veins: Patent Review of the MIP images confirms the above findings. NON-VASCULAR Lower chest: Lung bases clear Hepatobiliary: Cyst at central liver 2.2 x 1.7 cm series 6, image 16. Gallbladder and liver otherwise unremarkable. Pancreas: Normal appearance Spleen: Normal appearance Adrenals/Urinary Tract: Tiny BILATERAL renal cysts. Adrenal glands, kidneys, ureters, and bladder otherwise normal appearance. Stomach/Bowel: Diffuse diverticulosis of descending and sigmoid colon. Active site of IV contrast extravasation identified at the distal descending colon near the descending sigmoid junction, where a single diverticulum which is low attenuation on precontrast imaging demonstrates high attenuation postcontrast, and delayed images showing passage of extravasated blood into the colonic lumen. Findings consistent with a site of active GI bleeding. No additional sites of abnormal contrast extravasation identified. Stomach and remaining bowel loops unremarkable. Lymphatic: No adenopathy Reproductive: Unremarkable uterus and ovaries Other: No free air or free fluid.  No hernia. Musculoskeletal: Diffuse osseous demineralization. IMPRESSION: VASCULAR Scattered atherosclerotic plaque formation without evidence of critical stenosis. 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 No evidence of aneurysm or dissection. NON-VASCULAR Active site of IV  contrast extravasation at a diverticulum at the distal descending colon near the descending sigmoid junction with contrast extending in to the colonic lumen on delayed images consistent with active GI bleeding. Aortic Atherosclerosis (ICD10-I70.0). Findings discussed with Dr. Tarri Glenn on 04/23/2021 at 1943 hours. Electronically Signed   By: Lavonia Dana M.D.   On: 04/23/2021 19:51     LOS: 2 days   Oren Binet, MD  [facility]    To contact the attending provider between 7A-7P or the covering provider during after hours 7P-7A, please log into the web site [email or web address] and access using universal [place] password for that web site. If you do not have the password, please call the  hospital operator.  04/25/2021, 12:06 PM

## 2021-04-25 NOTE — Progress Notes (Addendum)
ANTICOAGULATION CONSULT NOTE - Follow Up Consult  Pharmacy Consult for Heparin Indication: pulmonary embolus and DVT of RLE  No Known Allergies  Patient Measurements: Height: 5\' 6"  (167.6 cm) Weight: 78.8 kg (173 lb 11.6 oz) IBW/kg (Calculated) : 59.3 Heparin Dosing Weight: 74.7 kg  Vital Signs: Temp: 99.4 F (37.4 C) (11/28 1937) Temp Source: Oral (11/28 1937) BP: 141/78 (11/28 1937) Pulse Rate: 90 (11/28 1937)  Labs: Recent Labs    04/23/21 0441 04/23/21 0938 04/24/21 0740 04/24/21 1419 04/24/21 1541 04/24/21 1545 04/25/21 0713 04/25/21 1235 04/25/21 1828  HGB 12.2   < > 10.4* 9.7*  --    < > 9.8* 9.6* 9.6*  HCT 36.9   < > 32.7* 30.5*  --    < > 30.5* 29.7* 30.0*  PLT 216   < > 178 189  --    < > 189 189 195  LABPROT 13.6  --   --   --   --   --   --   --   --   INR 1.0  --   --   --   --   --   --   --   --   HEPARINUNFRC  --   --   --   --   --   --   --   --  <0.10*  CREATININE 0.52   < > 0.59 0.62  --   --  0.54  --   --   TROPONINIHS  --   --   --  4 3  --   --   --   --    < > = values in this interval not displayed.    Estimated Creatinine Clearance: 63.4 mL/min (by C-G formula based on SCr of 0.54 mg/dL).  Assessment: 75 y.o female who presented on 04/22/21 with lower GI bleeding-thought to be due to diverticular etiology. Past medical history of HTN, HLD, GERD.  Found to have Sigmoid diverticular bleeding s/p embolization 04/23/21.  CT PE done today 04/25/21 found pulmonary embolism in RLL.  Pharmacy consulted to start IV heparin infusion.   MD ordered NO bolus due to diverticular bleeding.  MD noted dried/dark-colored blood on bedsheet earlier-given stability in hemoglobin-this is felt to represent old blood rather than fresh GI bleeding. No active bleeding reported.  Hgb trended down 12.1>>11.7>10.2>down to 9.8 this am;  Platelet count 241 on admit, down to 189 this am.    MD ordered to target heparin levels at lower end of therapeutic range.  Goal  will be 0.3-0.5 units/ml.  2nd shift: Initial heparin level is undetectable (< 0.10) on heparin drip at 1050 units/hr. No bleeding reported.  Hgb 9.6 and platelet count 195, stable. GI not clearing old luminal blood and has cleared for NOAC on 11/29 if no bleeding.   Duplex showed RLE DVT of indeterminate age.   Goal of Therapy:  Heparin level 0.3-0.5 units/ml Monitor platelets by anticoagulation protocol: Yes   Plan:  No heparin boluses. Increase heparin drip to 1200 units/hr (~2 units/kg/hr increase) Next heparin level with am labs, should be ~8 hours after rate change. Daily heparin level and CBC while on heparin. Monitor for bleeding. Follow up for transition to oral anticoagulation.  12/29, RPh 04/25/2021,8:11 PM

## 2021-04-25 NOTE — Progress Notes (Signed)
Lower extremity venous has been completed.   Preliminary results in CV Proc.   Stephanie Bowers 04/25/2021 2:13 PM

## 2021-04-25 NOTE — Progress Notes (Addendum)
Attending physician's note   I have taken an interval history, reviewed the chart and examined the patient. I agree with the Advanced Practitioner's note, impression, and recommendations as outlined.   No e/o active bleeding, just clearing of old luminal blood.  H/H otherwise stable.  CT angio chest n/f PE and started on heparin gtt with plan to transition to NOAC tomorrow if no e/o bleeding.  Discussed risk/benefit profile of anticoagulation in the setting of recent bleed with patient and her spouse at bedside.  They understand and would like to proceed with anticoagulation and monitor for e/o rebleed.  GI service will sign off at this time.  Please do not hesitate to contact me with additional questions or concerns.  Otherwise, she is planning to follow-up with her Gastroenterologist in [place].  Gerrit Heck, DO, FACG [phone] office                Daily Rounding Note  04/25/2021, 12:55 PM  LOS: 2 days   SUBJECTIVE:   Chief complaint:   Diverticular bleed   Stool today is black, old blood.  Yesterday had episode of syncope.  This occurred short while after passing a large amount of old dark blood.  She was out for a few minutes and staff performed some chest compressions though she was not pulseless.  Developed chest pain last night, persisting through this morning.  Today feels a little bit dizzy when she stands up. CT angio chest this morning shows a PE at right lower lobe and right sided pulmonary nodules.  Incidentally showing air-fluid levels in the esophagus suggesting dysmotility or GERD.  Patient tolerating solid food. After discussing case with Dr. Loletha Grayer, IV heparin is initiated.  OBJECTIVE:         Vital signs in last 24 hours:    Temp:  [98.8 F (37.1 C)-100 F (37.8 C)] 100 F (37.8 C) (11/28 1210) Pulse Rate:  [64-89] 80 (11/28 1210) Resp:  [17-26] 18 (11/28 1210) BP: (89-143)/(55-84) 138/84 (11/28  1210) SpO2:  [95 %-100 %] 97 % (11/28 1210) Last BM Date: 04/24/21 Filed Weights   04/22/21 2022 04/23/21 0500 04/24/21 0500  Weight: 76.2 kg 82.4 kg 78.8 kg   General: NAD.  Looks well. Heart: RRR. Chest: Clear bilaterally.  No labored breathing.  No cough. Abdomen: Soft, nontender, nondistended.  Bowel sounds active. Extremities: No CCE. Neuro/Psych: Oriented x3.  Intake/Output from previous day: 11/27 0701 - 11/28 0700 In: 2082.8 [P.O.:420; I.V.:1572.6; IV Piggyback:90.2] Out: 1000 [Urine:1000]  Intake/Output this shift: No intake/output data recorded.  Lab Results: Recent Labs    04/24/21 1545 04/25/21 0139 04/25/21 0713  WBC 13.2* 10.6* 10.1  HGB 10.2* 9.5* 9.8*  HCT 32.4* 29.4* 30.5*  PLT 204 179 189   BMET Recent Labs    04/24/21 0740 04/24/21 1419 04/25/21 0713  NA 136 135 138  K 4.0 3.7 3.3*  CL 107 107 107  CO2 $Re'22 24 24  'IOw$ GLUCOSE 103* 119* 104*  BUN $Re'18 17 13  'ovr$ CREATININE 0.59 0.62 0.54  CALCIUM 8.1* 7.8* 8.3*   LFT Recent Labs    04/24/21 0740 04/24/21 1419 04/25/21 0713  PROT 5.3* 5.1* 5.3*  ALBUMIN 2.9* 2.8* 2.9*  AST $Re'15 18 16  'axo$ ALT $R'12 14 15  'ui$ ALKPHOS 59 59 64  BILITOT 0.6 0.7 1.0   PT/INR Recent Labs    04/23/21 0441  LABPROT 13.6  INR 1.0   Hepatitis Panel No results for input(s): HEPBSAG, HCVAB, HEPAIGM, HEPBIGM in  the last 72 hours.  Studies/Results: DG Ribs Unilateral Left  Result Date: 04/24/2021 CLINICAL DATA:  CPR, rib pain. EXAM: LEFT RIBS - 2 VIEW COMPARISON:  None. FINDINGS: The lungs are clear. There is no pleural effusion or pneumothorax. The cardiomediastinal silhouette is within normal limits. There are acute nondisplaced left anterior fifth, sixth and seventh rib fractures. No displaced rib fractures are identified. IMPRESSION: 1. Acute nondisplaced left fifth, sixth and seventh rib fractures. 2. No other acute cardiopulmonary process. Electronically Signed   By: Ronney Asters M.D.   On: 04/24/2021 21:16   DG Ribs  Unilateral Right  Result Date: 04/24/2021 CLINICAL DATA:  Pt complains of bilateral rib pain. Pt states she received a single compression from CPR and that is when the pain started. No other injury noted. EXAM: RIGHT RIBS - 2 VIEW COMPARISON:  None. FINDINGS: No acute displaced fracture or other bone lesions are seen involving the right ribs. IMPRESSION: No acute displaced rib fracture. Please note, nondisplaced rib fractures may be occult on radiograph. Electronically Signed   By: Iven Finn M.D.   On: 04/24/2021 22:20   CT Angio Chest Pulmonary Embolism (PE) W or WO Contrast  Result Date: 04/25/2021 CLINICAL DATA:  PE suspected.  Low to intermediate probability. EXAM: CT ANGIOGRAPHY CHEST WITH CONTRAST TECHNIQUE: Multidetector CT imaging of the chest was performed using the standard protocol during bolus administration of intravenous contrast. Multiplanar CT image reconstructions and MIPs were obtained to evaluate the vascular anatomy. CONTRAST:  172mL OMNIPAQUE IOHEXOL 350 MG/ML SOLN COMPARISON:  Plain films of the ribs of 1 day prior. FINDINGS: Cardiovascular: The quality of this exam for evaluation of pulmonary embolism is sufficient. The bolus is suboptimally timed and there is minimal motion degradation. Right lower lobe segmental, nearly occlusive thrombus including on 309/8 and 92/9. Aortic atherosclerosis. Tortuous thoracic aorta. Normal heart size, without pericardial effusion. Lad coronary artery calcification. Mediastinum/Nodes: No mediastinal or hilar adenopathy. Contrast air level in the esophagus on 93/6. Lungs/Pleura: Trace right pleural fluid. 3 mm nodule on the right minor fissure on 78/7. 3-4 mm right middle lobe pulmonary nodule on 99/7. Upper Abdomen: Hepatic right hepatic lobe cysts. Normal imaged portions of the spleen, stomach, left adrenal gland. Musculoskeletal: No acute osseous abnormality. Review of the MIP images confirms the above findings. IMPRESSION: 1. Isolated right  lower lobe segmental pulmonary embolism. 2. Trace right pleural fluid. 3. Coronary artery atherosclerosis. Aortic Atherosclerosis (ICD10-I70.0). 4. Esophageal air fluid level suggests dysmotility or gastroesophageal reflux. 5. Right-sided pulmonary nodules of up to 4 mm. No follow-up needed if patient is low-risk. Non-contrast chest CT can be considered in 12 months if patient is high-risk. This recommendation follows the consensus statement: Guidelines for Management of Incidental Pulmonary Nodules Detected on CT Images: From the Fleischner Society 2017; Radiology 2017; 284:228-243. 1. These results will be called to the ordering clinician or representative by the Radiologist Assistant, and communication documented in the PACS or [employer]. Electronically Signed   By: Abigail Miyamoto M.D.   On: 04/25/2021 11:37   IR Angiogram Visceral Selective 22 08:53   Result Date: 04/24/2021 INDICATION: Acute lower GI bleeding. Positive CTA for active diverticular bleeding within the distal descending/proximal sigmoid colon. Please perform mesenteric arteriogram and percutaneous embolization as indicated. EXAM: 1. ULTRASOUND GUIDANCE FOR ARTERIAL ACCESS 2. SELECTIVE SUPERIOR MESENTERIC ARTERIOGRAM 3. FLUSH AORTOGRAM 4. SELECTIVE INFERIOR MESENTERIC ARTERIOGRAM 5. COMPARISON:  CTA abdomen and pelvis - earlier same day MEDICATIONS: None ANESTHESIA/SEDATION: Moderate (conscious) sedation was employed during this  procedure as administered by the Interventional Radiology RN. A total of Versed 4 mg and Fentanyl 100 mcg was administered intravenously. Moderate Sedation Time: 60 minutes. The patient's level of consciousness and vital signs were monitored continuously by radiology nursing throughout the procedure under my direct supervision. CONTRAST:  60 cc Omnipaque 300 FLUOROSCOPY TIME:  19.6 minutes (099 mGy) COMPLICATIONS: None immediate. PROCEDURE: Informed consent was obtained from the patient following explanation of  the procedure, risks, benefits and alternatives. All questions were addressed. A time out was performed prior to the initiation of the procedure. Maximal barrier sterile technique utilized including caps, mask, sterile gowns, sterile gloves, large sterile drape, hand hygiene, and Betadine prep. The right femoral head was marked fluoroscopically. Under sterile conditions and local anesthesia, the right common femoral artery access was performed with a micropuncture needle. Under direct ultrasound guidance, the right common femoral was accessed with a micropuncture kit. An ultrasound image was saved for documentation purposes. This allowed for placement of a 5-French vascular sheath. A limited arteriogram was performed through the side arm of the sheath confirming appropriate access within the right common femoral artery. Over a Bentson wire, a Mickelson catheter was advanced the caudal aspect of the thoracic aorta where was reformed, back bled and flushed. The Mickelson catheter was then utilized to select the superior mesenteric artery and a selective superior mesenteric arteriogram was performed. Next, the Mickelson catheter was utilized to attempt to cannulate the inferior mesenteric artery however this proved challenging given tortuosity of the abdominal aorta. As such, over Bentson wire, the Mickelson catheter was exchanged for an Omni Flush catheter and a flush abdominal aortogram was performed demarcating the origin of the IMA. Next, with the use of a 5 French angled glide catheter, the IMA was selected and a selective inferior mesenteric arteriogram was performed. With the use of a fathom 14 microwire, a regular Renegade microcatheter was advanced to the level of the trifurcation of the IMA and a sub selective inferior mesenteric arteriogram was performed. The microcatheter was then advanced to select the sigmoidal artery and a selective sigmoidal arteriogram was performed. The microcatheter was advanced into  the distal arcade of the sigmoidal artery, beyond the location of the distal tributaries supplying the ill-defined area of contrast extravasation within the distal descending/proximal sigmoid colon. Contrast injection confirmed appropriate positioning and the short-segment of the distal arcade was percutaneously coil embolized with overlapping 2 mm diameter interlock coils. The microcatheter was retracted to the level of the sigmoidal artery and a post embolization sigmoidal arteriogram was performed. The microcatheter was then utilized to select the left colic artery and a selective left colic arteriogram was performed Images were reviewed and the procedure was terminated. All wires, catheters and sheaths were removed from the patient. Hemostasis was achieved at the right groin access site with manual compression. The patient tolerated the procedure well without immediate post procedural complication. FINDINGS: Selective inferior mesenteric arteriogram demonstrates a conventional branching pattern and is negative for significant arterial supply to the distal descending/proximal sigmoid colon. No discrete areas of vessel irregularity or contrast extravasation are identified. Flush abdominal aortogram demonstrates patency of the IMA. The abdominal aorta is noted to be tortuous but of normal caliber as are the bilateral common and internal iliac arteries. Selective inferior mesenteric arteriogram demonstrates a conventional branching pattern. A ill-defined area of contrast extravasation is seen at the level of the distal descending/proximal sigmoid colon, correlating with the findings on preceding CTA. Dominant arterial supply to this area of contrast  extravasation is via the sigmoidal artery. Sub selective sigmoidal arteriogram confirms an ill-defined area of contrast extravasation supplied via a tiny tertiary branch from the distal arcade of the sigmoidal artery. The distal arcade was percutaneously coil embolized  across the origin of the tiny tertiary branch supplying the ill-defined area of contrast extravasation. Post embolization selective arteriograms performed both from the level of the sigmoidal and left colic arteries are negative for persistent active extravasation. IMPRESSION: Technically successful percutaneous coil embolization of a distal arcade of the sigmoidal artery across the origin of a tiny tertiary branch supplying an ill-defined area of contrast extravasation at the level of the distal descending/proximal sigmoid colon, correlating with the findings seen on preceding CTA. PLAN: - The patient is to remain flat for 4 hours with right leg straight. - The patient will continue to experience several additional bloody bowel movements and may continue to require additional resuscitation (as she was bleeding both before and during the procedure), however ultimately I am hopeful she will stabilize in the coming days. - While presumably secondary to diverticular disease, repeat colonoscopy after the resolution of acute symptoms is advised to exclude the presence of an underlying mass/lesion. Electronically Signed   By: Sandi Mariscal M.D.   On: 04/24/2021 08:53   Scheduled Meds:  escitalopram  10 mg Oral Daily   lidocaine  1 patch Transdermal Q24H   metoCLOPramide (REGLAN) injection  10 mg Intravenous Once   pantoprazole (PROTONIX) IV  40 mg Intravenous Q24H   rosuvastatin  20 mg Oral QHS   Continuous Infusions:  heparin     PRN Meds:.acetaminophen **OR** acetaminophen, lip balm, ondansetron (ZOFRAN) IV   ASSESMENT:      Diverticular bleed.  04/23/2021 coil embolization of distal arcade sigmoid artery.  Patient up-to-date on polyp surveillance colonoscopy by MD in [place].     Blood loss anemia.  Hgb nadir 9.5, 9.8 today.  13.3 at arrival.       New PE.   IV heparin initiated.   PLAN   GI signing off, available if needed for recurrent bleeding.  Needs to follow-up with her GI doctor in  [place].  Apparently has colonoscopy due early next year, this may need to be delayed given new need for anticoagulation.    Azucena Freed  04/25/2021, 12:55 PM Phone [phone]

## 2021-04-25 NOTE — Progress Notes (Signed)
  Echocardiogram 2D Echocardiogram has been performed.  Stephanie Bowers 04/25/2021, 10:28 AM

## 2021-04-25 NOTE — Progress Notes (Signed)
ANTICOAGULATION CONSULT NOTE - Initial Consult  Pharmacy Consult for Heparin  Indication: pulmonary embolus  No Known Allergies  Patient Measurements: Height: 5\' 6"  (167.6 cm) Weight: 78.8 kg (173 lb 11.6 oz) IBW/kg (Calculated) : 59.3 Heparin Dosing Weight:  74.7 kg  Vital Signs: Temp: 100 F (37.8 C) (11/28 1210) Temp Source: Oral (11/28 1210) BP: 138/84 (11/28 1210) Pulse Rate: 80 (11/28 1210)  Labs: Recent Labs    04/23/21 0441 04/23/21 0938 04/24/21 0740 04/24/21 1419 04/24/21 1541 04/24/21 1545 04/25/21 0139 04/25/21 0713  HGB 12.2   < > 10.4* 9.7*  --  10.2* 9.5* 9.8*  HCT 36.9   < > 32.7* 30.5*  --  32.4* 29.4* 30.5*  PLT 216   < > 178 189  --  204 179 189  LABPROT 13.6  --   --   --   --   --   --   --   INR 1.0  --   --   --   --   --   --   --   CREATININE 0.52   < > 0.59 0.62  --   --   --  0.54  TROPONINIHS  --   --   --  4 3  --   --   --    < > = values in this interval not displayed.    Estimated Creatinine Clearance: 63.4 mL/min (by C-G formula based on SCr of 0.54 mg/dL).   Medical History: Past Medical History:  Diagnosis Date   Diverticulosis    Hyperlipidemia    Hypertension    IBS (irritable bowel syndrome)     Medications:  Medications Prior to Admission  Medication Sig Dispense Refill Last Dose   ALPRAZolam (XANAX) 0.25 MG tablet Take 0.25 mg by mouth at bedtime as needed for sleep.   Past Month   aspirin 81 MG EC tablet Take 81 mg by mouth daily.   04/21/2021   dexlansoprazole (DEXILANT) 60 MG capsule Take 1 capsule by mouth daily.   04/21/2021   escitalopram (LEXAPRO) 10 MG tablet Take 10 mg by mouth daily.   04/21/2021   hydrochlorothiazide (MICROZIDE) 12.5 MG capsule Take 12.5 mg by mouth daily.   04/21/2021   losartan (COZAAR) 100 MG tablet Take 100 mg by mouth daily.   04/21/2021   Multiple Vitamin (MULTIVITAMIN) tablet Take 1 tablet by mouth every other day.   04/20/2021   rosuvastatin (CRESTOR) 20 MG tablet Take 20 mg by  mouth at bedtime.   04/21/2021    Assessment: 75 y.o female who presented on 04/22/21 with lower GI bleeding-thought to be due to diverticular etiology. Past medical history of HTN, HLD, GERD.  Found to have Sigmoid diverticular bleeding s/p embolization 04/23/21.  CT PE done today 04/25/21 found pulmonary embolism in RLL.  Pharmacy consulted to start IV heparin infusion.   MD order NO bolus due to diverticular bleeding.  MD noted dried/dark-colored blood on bedsheet earlier-given stability in hemoglobin-this is felt to represent old blood rather than fresh GI bleeding.   CBC:  Hgb trending down 12.1>>11.7>10.2>down to 9.8 today. PLTCl = 204 down to 189k today.  No active bleeding reported.   MD orders to target heparin level at lower end of therapeutic range.   Goal will be 0.3-0.5 units/ml.   Goal of Therapy:  Heparin level 0.3-0.5 units/ml (lower end of therapeutic range per MD order Monitor platelet count   Plan:  Start IV heparin infusion at 1050 units/hr (~  14 units/kg/hr, lower rate due to recen h/o bleeding and low Hgb, discussed with Dr. Jerral Ralph).  No bolus per MD's order.  Check 6 hr heparin level (schedule with the q6h CBC that MD has ordered) Daily HL and CBC while on IV heparin .     Thank you for allowing pharmacy to be part of this patients care team. Noah Delaine, RPh Clinical Pharmacist [phone]  04/25/2021,12:19 PM Please check AMION for all [facility] phone numbers After 10:00 PM, call Main Pharmacy [phone]

## 2021-04-26 ENCOUNTER — Other Ambulatory Visit (HOSPITAL_COMMUNITY): Payer: Self-pay

## 2021-04-26 LAB — HEPARIN LEVEL (UNFRACTIONATED): Heparin Unfractionated: 0.26 [IU]/mL — ABNORMAL LOW (ref 0.30–0.70)

## 2021-04-26 LAB — CBC
HCT: 30.8 % — ABNORMAL LOW (ref 36.0–46.0)
HCT: 30.3 % — ABNORMAL LOW (ref 36.0–46.0)
Hemoglobin: 9.9 g/dL — ABNORMAL LOW (ref 12.0–15.0)
Hemoglobin: 10 g/dL — ABNORMAL LOW (ref 12.0–15.0)
MCH: 29.5 pg (ref 26.0–34.0)
MCH: 30.4 pg (ref 26.0–34.0)
MCHC: 32.1 g/dL (ref 30.0–36.0)
MCHC: 33 g/dL (ref 30.0–36.0)
MCV: 91.7 fL (ref 80.0–100.0)
MCV: 92.1 fL (ref 80.0–100.0)
Platelets: 176 K/uL (ref 150–400)
Platelets: 184 K/uL (ref 150–400)
RBC: 3.36 MIL/uL — ABNORMAL LOW (ref 3.87–5.11)
RBC: 3.29 MIL/uL — ABNORMAL LOW (ref 3.87–5.11)
RDW: 13.2 % (ref 11.5–15.5)
RDW: 13.1 % (ref 11.5–15.5)
WBC: 10.2 K/uL (ref 4.0–10.5)
WBC: 9.8 K/uL (ref 4.0–10.5)
nRBC: 0 % (ref 0.0–0.2)
nRBC: 0 % (ref 0.0–0.2)

## 2021-04-26 MED ORDER — APIXABAN (ELIQUIS) VTE STARTER PACK (10MG AND 5MG)
ORAL_TABLET | ORAL | 0 refills | Status: AC
Start: 2021-04-26 — End: ?
  Filled 2021-04-26: qty 74, 30d supply, fill #0

## 2021-04-26 MED ORDER — APIXABAN 5 MG PO TABS
ORAL_TABLET | ORAL | Status: AC
  Filled 2021-04-26: qty 2

## 2021-04-26 MED ORDER — POTASSIUM CHLORIDE CRYS ER 20 MEQ PO TBCR
EXTENDED_RELEASE_TABLET | ORAL | Status: AC
  Filled 2021-04-26: qty 2

## 2021-04-26 MED ORDER — POTASSIUM CHLORIDE CRYS ER 20 MEQ PO TBCR
40.0000 meq | EXTENDED_RELEASE_TABLET | Freq: Once | ORAL | Status: AC
  Administered 2021-04-26: 40 meq via ORAL

## 2021-04-26 MED ORDER — APIXABAN 5 MG PO TABS
10.0000 mg | ORAL_TABLET | Freq: Two times a day (BID) | ORAL | Status: DC
  Administered 2021-04-26: 10 mg via ORAL

## 2021-04-26 MED ORDER — APIXABAN 5 MG PO TABS: 5.0000 mg | ORAL_TABLET | Freq: Two times a day (BID) | ORAL | Status: DC

## 2021-04-26 NOTE — Plan of Care (Signed)

## 2021-04-26 NOTE — TOC Benefit Eligibility Note (Signed)
Patient Product/process development scientist completed.    The patient is currently admitted and upon discharge could be taking Eliquis 5 mg.  The current 30 day co-pay is, $81.52.   The patient is currently admitted and upon discharge could be taking Xarelto 20 mg.  The current 30 day co-pay is, $79.61.   The patient is insured through [employer] Medicare Part D     Roland Earl, CPhT Pharmacy Patient Advocate Specialist [facility] Patient Advocate Team Direct Number: [phone]  Fax: [phone]

## 2021-04-26 NOTE — TOC Transition Note (Signed)
Transition of Care [facility]) - CM/SW Discharge Note   Patient Details  Name: Stephanie Bowers MRN: [id number] Date of Birth: 10/07/1945  [facility] CM/SW Contact:  Lawerance Sabal, RN Phone Number: 04/26/2021, 11:26 AM   Clinical Narrative:   Sherron Monday w patient, spouse, daughter and CSW at bedside. Provided with resources for private duty care in [place] area. Ordered RW to room for DC at patient request.     Final next level of care: Home/Self Care Barriers to Discharge: No Barriers Identified   Patient Goals and CMS Choice        Discharge Placement                       Discharge Plan and Services                DME Arranged: Walker rolling DME Agency: [employer] Date DME Agency Contacted: 04/26/21 Time DME Agency Contacted: [id number] Representative spoke with at DME Agency: [facility] Arranged: NA          Social Determinants of Health (SDOH) Interventions     Readmission Risk Interventions No flowsheet data found.

## 2021-04-26 NOTE — Care Management Important Message (Signed)
Important Message  Patient Details  Name: Quandra Fedorchak MRN: [id number] Date of Birth: 1945/08/16   Medicare Important Message Given:  Yes     Dorena Bodo 04/26/2021, 2:20 PM

## 2021-04-26 NOTE — Progress Notes (Signed)
Patient is s/p mesenteric arteriogram and percutaneous coil embolization of distal arcade of sigmoidal artery with IR on 04/23/21.   Hospital course complicated by development of PR, patient placed on heparin infusion and now she is transitioning to Eliquis.   Chart checked, patient appears to be hemodynamically stable.  IR will sign off but is available for further assistant anytime.  Please call IR for questions and concerns.    Chontel Warning H Shoaib Siefker PA-C 04/26/2021 1:10 PM

## 2021-04-26 NOTE — Progress Notes (Signed)
ANTICOAGULATION CONSULT NOTE - Follow Up Consult  Pharmacy Consult for heparin Indication:  PE/DVT in setting of recent GIB  Labs: Recent Labs    04/23/21 0441 04/23/21 0938 04/24/21 0740 04/24/21 1419 04/24/21 1541 04/24/21 1545 04/25/21 0713 04/25/21 1235 04/25/21 1828 04/26/21 0302  HGB 12.2   < > 10.4* 9.7*  --    < > 9.8* 9.6* 9.6* 9.9*  HCT 36.9   < > 32.7* 30.5*  --    < > 30.5* 29.7* 30.0* 30.8*  PLT 216   < > 178 189  --    < > 189 189 195 176  LABPROT 13.6  --   --   --   --   --   --   --   --   --   INR 1.0  --   --   --   --   --   --   --   --   --   HEPARINUNFRC  --   --   --   --   --   --   --   --  <0.10* 0.26*  CREATININE 0.52   < > 0.59 0.62  --   --  0.54  --   --   --   TROPONINIHS  --   --   --  4 3  --   --   --   --   --    < > = values in this interval not displayed.    Assessment: 75yo female remains subtherapeutic on heparin after rate change though approaching goal; no infusion issues or signs of bleeding per RN, Hgb remains stable.  Goal of Therapy:  Heparin level 0.3-0.5 units/ml   Plan:  Will increase heparin infusion by ~1 units/kg/hr to 1300 units/hr and check level in 8 hours.    Vernard Gambles, PharmD, BCPS  04/26/2021,4:21 AM

## 2021-04-26 NOTE — Discharge Summary (Signed)
PATIENT DETAILS Name: Nysia Dell Age: 75 y.o. Sex: female Date of Birth: March 13, 1946 MRN: [id number]. Admitting Physician: Hannah Beat, MD [id number], Lawson Fiscal, MD  Admit Date: 04/22/2021 Discharge date: 04/26/2021  Recommendations for Outpatient Follow-up:  Follow up with PCP in 1-2 weeks Please obtain CMP/CBC in one week Please ensure outpatient follow-up with hematology, cardiology, gastroenterology If patient has recurrent GI bleeding-May need IVC filter placement. Incidental finding-right lower lobe nodule-defer further to her primary care practitioner for continued surveillance.  Admitted From:  Home  Disposition: Home   Home Health: No  Equipment/Devices: None  Discharge Condition: Stable  CODE STATUS: FULL CODE  Diet recommendation:  Diet Order             Diet - low sodium heart healthy           Diet regular Room service appropriate? Yes; Fluid consistency: Thin  Diet effective now                    Brief Summary: Patient is a 75 y.o. female with history of HTN, HLD, GERD-who presented with lower GI bleeding-thought to be due to diverticular etiology.  She was subsequently admitted to the hospitalist service-a CT angiogram of the abdomen was positive-she underwent embolization procedure by IR.  On 11/27-she sustained a syncopal episode after having a bowel movement-a CT angiogram of the chest on 11/28 was positive for PE.  See below for further details.  Brief Hospital Course: Lower GI bleeding with acute blood loss anemia: Bleeding has subsided-hemoglobin is stable-she was managed with supportive care and underwent embolization of culprit vessel by IR on 11/26.  Continue to watch closely-PCP to repeat CBC in 1 week.  Patient plans to follow-up with her primary gastroenterologist in [place] for a repeat colonoscopy.  No further recommendations from gastroenterology here in the hospital.  Syncope: Occurred on 11/27-after she had a BM.   Syncope was very brief-nursing staff initiated chest compressions-before she spontaneously recovered consciousness.  She never lost pulse.  Telemetry was negative for arrhythmias.  Initially this was felt to be probably vagal etiology given that this occurred after she had a BM-however she does have a PE as well-and that could be the culprit as well.  Echo with stable EF.  She is now on full dose anticoagulation which she has tolerated well for the past 24 hours-she will be switched to Eliquis on discharge.   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  Unclear whether this was the only cause for her syncope (she was briefly hypotensive post syncope-?  Orthostatic-and also could have had a vasovagal episode following a BM).  Difficult situation as she presented with lower GI bleeding-since her hemoglobin stable-and GI bleeding had more or less subsided-after extensive discussion with GI MD-and with patient/spouse-she was started on IV heparin.  Echo was stable-however a lower extremity Doppler showed age indeterminate DVT in the right peroneal vein.  Thankfully she has tolerated IV heparin well without any major bleeding issues-hemoglobin continues to be stable (in fact better than yesterday).  Per discussion with GI-if patient did not have any further bleeding episodes while on IV heparin-okay to change her to Eliquis on discharge.  Extensive counseling has been done with patient/family-all aware of risks of bleeding-and need to seek immediate medical attention if that were to happen.  If patient has recurrent GI bleeding in the near future-she may benefit from a IVC filter.  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 can be pursued in the outpatient setting.  Based on hematology evaluation-appropriate duration of anticoagulation can  be determined-however since it is unprovoked-may require indefinite anticoagulation if she does not have any bleeding complications.   Chest pain: Either from chest compressions as she received when she syncopized on 11/27 or due to PE.   Episodes of narrow complex tachycardia on 11/27 evening: Reviewed with cardiologist-Dr. Gasper Sells over the phone-most of these tachycardias are not A. fib-there is a questionable 3-second strip where patient may have some irregularity-but not felt to have significant A. fib burden to have caused her prior CVA in 2016.  Patient will establish herself with a cardiologist when she goes back to [facility].  In any event-even if she has had a brief episode of PAF-she is already on anticoagulation.   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  Was maintained on aspirin-on hold due to GI bleeding.  Now has a PE-few plan to continue with Eliquis on discharge-and aspirin will be discontinued given risk of bleeding.  GERD: Continue PPI   HTN: BP now creeping up-was briefly hypotensive after syncopal episode on 11/27-and responded quickly to IVF.  Plan is to resume usual antihypertensive on discharge.  HLD: Continue statin    Mood disorder: Continue Lexapro    4 mm right sided lung nodule: Will require outpatient follow-up with PCP.  Incidentally seen on CT angiogram of the chest.  Patient aware of this finding and need for outpatient surveillance by her primary care practitioner.   BMI Estimated body mass index is 28.04 kg/m as calculated from the following:   Height as of this encounter: $RemoveBeforeD'5\' 6"'[id number]$  (1.676 m).   Weight as of this encounter: 78.8 kg.    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  Get Medicines reviewed and adjusted: Please take all your medications with you for your next visit with your Primary MD  Laboratory/radiological data: Please request your Primary MD to go over all hospital tests and procedure/radiological results at the follow up, please ask your Primary MD to get all Hospital records sent to his/her office.  In some cases, they will be blood work, cultures and biopsy results pending at the time of your discharge. Please request that your primary care M.D. follows up on these results.  Also Note the following: If you experience worsening of your admission symptoms, develop shortness of breath, life threatening emergency, suicidal or homicidal thoughts you must  seek medical attention immediately by calling 911 or calling your MD immediately  if symptoms less severe.  You must read complete instructions/literature along with all the possible adverse reactions/side effects for all the Medicines you take and that have been prescribed to you. Take any new Medicines after you have completely understood and accpet all the possible adverse reactions/side effects.   Do not drive when taking Pain medications or sleeping medications (Benzodaizepines)  Do not take more than prescribed Pain, Sleep and Anxiety Medications. It is not advisable to combine anxiety,sleep and  pain medications without talking with your primary care practitioner  Special Instructions: If you have smoked or chewed Tobacco  in the last 2 yrs please stop smoking, stop any regular Alcohol  and or any Recreational drug use.  Wear Seat belts while driving.  Please note: You were cared for by a hospitalist during your hospital stay. Once you are discharged, your primary care physician will handle any further medical issues. Please note that NO REFILLS for any discharge medications will be authorized once you are discharged, as it is imperative that you return to your primary care physician (or establish a relationship with a primary care physician if you do not have one) for your post hospital discharge needs so that they can reassess your need for medications and monitor your lab values.   1.  Incidental finding-4 mm right lung nodule-please ask your primary care practitioner for continued outpatient surveillance.  2.  Please ask your primary care practitioner for referral for hematology-they will then determine appropriate duration of anticoagulation.  3.  Please follow-up with the primary gastroenterologist for repeat colonoscopy.  4.  Please follow-up with a cardiologist of your choice.  5.  You  will be discharged on a Eliquis starter pack-please call your primary care practitioner in the next week or so to get further refills.   Increase activity slowly   Complete by: As directed    No dressing needed   Complete by: As directed       Allergies as of 04/26/2021   No Known Allergies      Medication List     STOP taking these medications    aspirin 81 MG EC tablet       TAKE these medications    ALPRAZolam 0.25 MG tablet Commonly known as: XANAX Take 0.25 mg by mouth at bedtime as needed for sleep.   Apixaban Starter Pack (10mg  and 5mg ) Commonly known as: ELIQUIS STARTER PACK Take as directed on package: start with two-5mg  tablets twice daily for 7 days. On  05/03/21, switch to one-5mg  tablet twice daily.   dexlansoprazole 60 MG capsule Commonly known as: DEXILANT Take 1 capsule by mouth daily.   escitalopram 10 MG tablet Commonly known as: LEXAPRO Take 10 mg by mouth daily.   hydrochlorothiazide 12.5 MG capsule Commonly known as: MICROZIDE Take 12.5 mg by mouth daily.   losartan 100 MG tablet Commonly known as: COZAAR Take 100 mg by mouth daily.   multivitamin tablet Take 1 tablet by mouth every other day.   rosuvastatin 20 MG tablet Commonly known as: CRESTOR Take 20 mg by mouth at bedtime.               Durable Medical Equipment  (From admission, onward)           Start     Ordered   04/26/21 1129  For home use only DME Walker rolling  Once  Question Answer Comment  Walker: With [place]   Patient needs a walker to treat with the following condition Weakness      04/26/21 1128              Discharge Care Instructions  (From admission, onward)           Start     Ordered   04/26/21 0000  No dressing needed        04/26/21 1155            Follow-up Information     Norton Pastel, MD. Schedule an appointment as soon as possible for a visit in 1 week(s).   Specialty: Family Medicine Contact information: [place] [address] [phone]                No Known Allergies    Consultations:  GI    Other Procedures/Studies: DG Ribs Unilateral Left  Result Date: 04/24/2021 CLINICAL DATA:  CPR, rib pain. EXAM: LEFT RIBS - 2 VIEW COMPARISON:  None. FINDINGS: The lungs are clear. There is no pleural effusion or pneumothorax. The cardiomediastinal silhouette is within normal limits. There are acute nondisplaced left anterior fifth, sixth and seventh rib fractures. No displaced rib fractures are identified. IMPRESSION: 1. Acute nondisplaced left fifth, sixth and seventh rib fractures. 2. No other acute cardiopulmonary process. Electronically Signed   By:  Ronney Asters M.D.   On: 04/24/2021 21:16   DG Ribs Unilateral Right  Result Date: 04/24/2021 CLINICAL DATA:  Pt complains of bilateral rib pain. Pt states she received a single compression from CPR and that is when the pain started. No other injury noted. EXAM: RIGHT RIBS - 2 VIEW COMPARISON:  None. FINDINGS: No acute displaced fracture or other bone lesions are seen involving the right ribs. IMPRESSION: No acute displaced rib fracture. Please note, nondisplaced rib fractures may be occult on radiograph. Electronically Signed   By: Iven Finn M.D.   On: 04/24/2021 22:20   CT Angio Chest Pulmonary Embolism (PE) W or WO Contrast  Result Date: 04/25/2021 CLINICAL DATA:  PE suspected.  Low to intermediate probability. EXAM: CT ANGIOGRAPHY CHEST WITH CONTRAST TECHNIQUE: Multidetector CT imaging of the chest was performed using the standard protocol during bolus administration of intravenous contrast. Multiplanar CT image reconstructions and MIPs were obtained to evaluate the vascular anatomy. CONTRAST:  146mL OMNIPAQUE IOHEXOL 350 MG/ML SOLN COMPARISON:  Plain films of the ribs of 1 day prior. FINDINGS: Cardiovascular: The quality of this exam for evaluation of pulmonary embolism is sufficient. The bolus is suboptimally timed and there is minimal motion degradation. Right lower lobe segmental, nearly occlusive thrombus including on 309/8 and 92/9. Aortic atherosclerosis. Tortuous thoracic aorta. Normal heart size, without pericardial effusion. Lad coronary artery calcification. Mediastinum/Nodes: No mediastinal or hilar adenopathy. Contrast air level in the esophagus on 93/6. Lungs/Pleura: Trace right pleural fluid. 3 mm nodule on the right minor fissure on 78/7. 3-4 mm right middle lobe pulmonary nodule on 99/7. Upper Abdomen: Hepatic right hepatic lobe cysts. Normal imaged portions of the spleen, stomach, left adrenal gland. Musculoskeletal: No acute osseous abnormality. Review of the MIP images  confirms the above findings. IMPRESSION: 1. Isolated right lower lobe segmental pulmonary embolism. 2. Trace right pleural fluid. 3. Coronary artery atherosclerosis. Aortic Atherosclerosis (ICD10-I70.0). 4. Esophageal air fluid level suggests dysmotility or gastroesophageal reflux. 5. Right-sided pulmonary nodules of up to 4 mm. No follow-up needed if patient is low-risk. Non-contrast chest CT can be  considered in 12 months if patient is high-risk. This recommendation follows the consensus statement: Guidelines for Management of Incidental Pulmonary Nodules Detected on CT Images: From the Fleischner Society 2017; Radiology 2017; 284:228-243. 1. These results will be called to the ordering clinician or representative by the Radiologist Assistant, and communication documented in the PACS or [employer]. Electronically Signed   By: Abigail Miyamoto M.D.   On: 04/25/2021 11:37   IR Angiogram Visceral Selective  Result Date: 04/24/2021 INDICATION: Acute lower GI bleeding. Positive CTA for active diverticular bleeding within the distal descending/proximal sigmoid colon. Please perform mesenteric arteriogram and percutaneous embolization as indicated. EXAM: 1. ULTRASOUND GUIDANCE FOR ARTERIAL ACCESS 2. SELECTIVE SUPERIOR MESENTERIC ARTERIOGRAM 3. FLUSH AORTOGRAM 4. SELECTIVE INFERIOR MESENTERIC ARTERIOGRAM 5. COMPARISON:  CTA abdomen and pelvis - earlier same day MEDICATIONS: None ANESTHESIA/SEDATION: Moderate (conscious) sedation was employed during this procedure as administered by the Interventional Radiology RN. A total of Versed 4 mg and Fentanyl 100 mcg was administered intravenously. Moderate Sedation Time: 60 minutes. The patient's level of consciousness and vital signs were monitored continuously by radiology nursing throughout the procedure under my direct supervision. CONTRAST:  60 cc Omnipaque 300 FLUOROSCOPY TIME:  19.6 minutes (263 mGy) COMPLICATIONS: None immediate. PROCEDURE: Informed consent was  obtained from the patient following explanation of the procedure, risks, benefits and alternatives. All questions were addressed. A time out was performed prior to the initiation of the procedure. Maximal barrier sterile technique utilized including caps, mask, sterile gowns, sterile gloves, large sterile drape, hand hygiene, and Betadine prep. The right femoral head was marked fluoroscopically. Under sterile conditions and local anesthesia, the right common femoral artery access was performed with a micropuncture needle. Under direct ultrasound guidance, the right common femoral was accessed with a micropuncture kit. An ultrasound image was saved for documentation purposes. This allowed for placement of a 5-French vascular sheath. A limited arteriogram was performed through the side arm of the sheath confirming appropriate access within the right common femoral artery. Over a Bentson wire, a Mickelson catheter was advanced the caudal aspect of the thoracic aorta where was reformed, back bled and flushed. The Mickelson catheter was then utilized to select the superior mesenteric artery and a selective superior mesenteric arteriogram was performed. Next, the Mickelson catheter was utilized to attempt to cannulate the inferior mesenteric artery however this proved challenging given tortuosity of the abdominal aorta. As such, over Bentson wire, the Mickelson catheter was exchanged for an Omni Flush catheter and a flush abdominal aortogram was performed demarcating the origin of the IMA. Next, with the use of a 5 French angled glide catheter, the IMA was selected and a selective inferior mesenteric arteriogram was performed. With the use of a fathom 14 microwire, a regular Renegade microcatheter was advanced to the level of the trifurcation of the IMA and a sub selective inferior mesenteric arteriogram was performed. The microcatheter was then advanced to select the sigmoidal artery and a selective sigmoidal arteriogram  was performed. The microcatheter was advanced into the distal arcade of the sigmoidal artery, beyond the location of the distal tributaries supplying the ill-defined area of contrast extravasation within the distal descending/proximal sigmoid colon. Contrast injection confirmed appropriate positioning and the short-segment of the distal arcade was percutaneously coil embolized with overlapping 2 mm diameter interlock coils. The microcatheter was retracted to the level of the sigmoidal artery and a post embolization sigmoidal arteriogram was performed. The microcatheter was then utilized to select the left colic artery and a selective left  colic arteriogram was performed Images were reviewed and the procedure was terminated. All wires, catheters and sheaths were removed from the patient. Hemostasis was achieved at the right groin access site with manual compression. The patient tolerated the procedure well without immediate post procedural complication. FINDINGS: Selective inferior mesenteric arteriogram demonstrates a conventional branching pattern and is negative for significant arterial supply to the distal descending/proximal sigmoid colon. No discrete areas of vessel irregularity or contrast extravasation are identified. Flush abdominal aortogram demonstrates patency of the IMA. The abdominal aorta is noted to be tortuous but of normal caliber as are the bilateral common and internal iliac arteries. Selective inferior mesenteric arteriogram demonstrates a conventional branching pattern. A ill-defined area of contrast extravasation is seen at the level of the distal descending/proximal sigmoid colon, correlating with the findings on preceding CTA. Dominant arterial supply to this area of contrast extravasation is via the sigmoidal artery. Sub selective sigmoidal arteriogram confirms an ill-defined area of contrast extravasation supplied via a tiny tertiary branch from the distal arcade of the sigmoidal artery.  The distal arcade was percutaneously coil embolized across the origin of the tiny tertiary branch supplying the ill-defined area of contrast extravasation. Post embolization selective arteriograms performed both from the level of the sigmoidal and left colic arteries are negative for persistent active extravasation. IMPRESSION: Technically successful percutaneous coil embolization of a distal arcade of the sigmoidal artery across the origin of a tiny tertiary branch supplying an ill-defined area of contrast extravasation at the level of the distal descending/proximal sigmoid colon, correlating with the findings seen on preceding CTA. PLAN: - The patient is to remain flat for 4 hours with right leg straight. - The patient will continue to experience several additional bloody bowel movements and may continue to require additional resuscitation (as she was bleeding both before and during the procedure), however ultimately I am hopeful she will stabilize in the coming days. - While presumably secondary to diverticular disease, repeat colonoscopy after the resolution of acute symptoms is advised to exclude the presence of an underlying mass/lesion. Electronically Signed   By: Sandi Mariscal M.D.   On: 04/24/2021 08:53   IR Angiogram Visceral Selective  Result Date: 04/24/2021 INDICATION: Acute lower GI bleeding. Positive CTA for active diverticular bleeding within the distal descending/proximal sigmoid colon. Please perform mesenteric arteriogram and percutaneous embolization as indicated. EXAM: 1. ULTRASOUND GUIDANCE FOR ARTERIAL ACCESS 2. SELECTIVE SUPERIOR MESENTERIC ARTERIOGRAM 3. FLUSH AORTOGRAM 4. SELECTIVE INFERIOR MESENTERIC ARTERIOGRAM 5. COMPARISON:  CTA abdomen and pelvis - earlier same day MEDICATIONS: None ANESTHESIA/SEDATION: Moderate (conscious) sedation was employed during this procedure as administered by the Interventional Radiology RN. A total of Versed 4 mg and Fentanyl 100 mcg was administered  intravenously. Moderate Sedation Time: 60 minutes. The patient's level of consciousness and vital signs were monitored continuously by radiology nursing throughout the procedure under my direct supervision. CONTRAST:  60 cc Omnipaque 300 FLUOROSCOPY TIME:  19.6 minutes (169 mGy) COMPLICATIONS: None immediate. PROCEDURE: Informed consent was obtained from the patient following explanation of the procedure, risks, benefits and alternatives. All questions were addressed. A time out was performed prior to the initiation of the procedure. Maximal barrier sterile technique utilized including caps, mask, sterile gowns, sterile gloves, large sterile drape, hand hygiene, and Betadine prep. The right femoral head was marked fluoroscopically. Under sterile conditions and local anesthesia, the right common femoral artery access was performed with a micropuncture needle. Under direct ultrasound guidance, the right common femoral was accessed with a micropuncture kit. An  ultrasound image was saved for documentation purposes. This allowed for placement of a 5-French vascular sheath. A limited arteriogram was performed through the side arm of the sheath confirming appropriate access within the right common femoral artery. Over a Bentson wire, a Mickelson catheter was advanced the caudal aspect of the thoracic aorta where was reformed, back bled and flushed. The Mickelson catheter was then utilized to select the superior mesenteric artery and a selective superior mesenteric arteriogram was performed. Next, the Mickelson catheter was utilized to attempt to cannulate the inferior mesenteric artery however this proved challenging given tortuosity of the abdominal aorta. As such, over Bentson wire, the Mickelson catheter was exchanged for an Omni Flush catheter and a flush abdominal aortogram was performed demarcating the origin of the IMA. Next, with the use of a 5 French angled glide catheter, the IMA was selected and a selective  inferior mesenteric arteriogram was performed. With the use of a fathom 14 microwire, a regular Renegade microcatheter was advanced to the level of the trifurcation of the IMA and a sub selective inferior mesenteric arteriogram was performed. The microcatheter was then advanced to select the sigmoidal artery and a selective sigmoidal arteriogram was performed. The microcatheter was advanced into the distal arcade of the sigmoidal artery, beyond the location of the distal tributaries supplying the ill-defined area of contrast extravasation within the distal descending/proximal sigmoid colon. Contrast injection confirmed appropriate positioning and the short-segment of the distal arcade was percutaneously coil embolized with overlapping 2 mm diameter interlock coils. The microcatheter was retracted to the level of the sigmoidal artery and a post embolization sigmoidal arteriogram was performed. The microcatheter was then utilized to select the left colic artery and a selective left colic arteriogram was performed Images were reviewed and the procedure was terminated. All wires, catheters and sheaths were removed from the patient. Hemostasis was achieved at the right groin access site with manual compression. The patient tolerated the procedure well without immediate post procedural complication. FINDINGS: Selective inferior mesenteric arteriogram demonstrates a conventional branching pattern and is negative for significant arterial supply to the distal descending/proximal sigmoid colon. No discrete areas of vessel irregularity or contrast extravasation are identified. Flush abdominal aortogram demonstrates patency of the IMA. The abdominal aorta is noted to be tortuous but of normal caliber as are the bilateral common and internal iliac arteries. Selective inferior mesenteric arteriogram demonstrates a conventional branching pattern. A ill-defined area of contrast extravasation is seen at the level of the distal  descending/proximal sigmoid colon, correlating with the findings on preceding CTA. Dominant arterial supply to this area of contrast extravasation is via the sigmoidal artery. Sub selective sigmoidal arteriogram confirms an ill-defined area of contrast extravasation supplied via a tiny tertiary branch from the distal arcade of the sigmoidal artery. The distal arcade was percutaneously coil embolized across the origin of the tiny tertiary branch supplying the ill-defined area of contrast extravasation. Post embolization selective arteriograms performed both from the level of the sigmoidal and left colic arteries are negative for persistent active extravasation. IMPRESSION: Technically successful percutaneous coil embolization of a distal arcade of the sigmoidal artery across the origin of a tiny tertiary branch supplying an ill-defined area of contrast extravasation at the level of the distal descending/proximal sigmoid colon, correlating with the findings seen on preceding CTA. PLAN: - The patient is to remain flat for 4 hours with right leg straight. - The patient will continue to experience several additional bloody bowel movements and may continue to require additional resuscitation (as  she was bleeding both before and during the procedure), however ultimately I am hopeful she will stabilize in the coming days. - While presumably secondary to diverticular disease, repeat colonoscopy after the resolution of acute symptoms is advised to exclude the presence of an underlying mass/lesion. Electronically Signed   By: Sandi Mariscal M.D.   On: 04/24/2021 08:53   IR Angiogram Selective Each Additional Vessel  Result Date: 04/24/2021 INDICATION: Acute lower GI bleeding. Positive CTA for active diverticular bleeding within the distal descending/proximal sigmoid colon. Please perform mesenteric arteriogram and percutaneous embolization as indicated. EXAM: 1. ULTRASOUND GUIDANCE FOR ARTERIAL ACCESS 2. SELECTIVE SUPERIOR  MESENTERIC ARTERIOGRAM 3. FLUSH AORTOGRAM 4. SELECTIVE INFERIOR MESENTERIC ARTERIOGRAM 5. COMPARISON:  CTA abdomen and pelvis - earlier same day MEDICATIONS: None ANESTHESIA/SEDATION: Moderate (conscious) sedation was employed during this procedure as administered by the Interventional Radiology RN. A total of Versed 4 mg and Fentanyl 100 mcg was administered intravenously. Moderate Sedation Time: 60 minutes. The patient's level of consciousness and vital signs were monitored continuously by radiology nursing throughout the procedure under my direct supervision. CONTRAST:  60 cc Omnipaque 300 FLUOROSCOPY TIME:  19.6 minutes (583 mGy) COMPLICATIONS: None immediate. PROCEDURE: Informed consent was obtained from the patient following explanation of the procedure, risks, benefits and alternatives. All questions were addressed. A time out was performed prior to the initiation of the procedure. Maximal barrier sterile technique utilized including caps, mask, sterile gowns, sterile gloves, large sterile drape, hand hygiene, and Betadine prep. The right femoral head was marked fluoroscopically. Under sterile conditions and local anesthesia, the right common femoral artery access was performed with a micropuncture needle. Under direct ultrasound guidance, the right common femoral was accessed with a micropuncture kit. An ultrasound image was saved for documentation purposes. This allowed for placement of a 5-French vascular sheath. A limited arteriogram was performed through the side arm of the sheath confirming appropriate access within the right common femoral artery. Over a Bentson wire, a Mickelson catheter was advanced the caudal aspect of the thoracic aorta where was reformed, back bled and flushed. The Mickelson catheter was then utilized to select the superior mesenteric artery and a selective superior mesenteric arteriogram was performed. Next, the Mickelson catheter was utilized to attempt to cannulate the inferior  mesenteric artery however this proved challenging given tortuosity of the abdominal aorta. As such, over Bentson wire, the Mickelson catheter was exchanged for an Omni Flush catheter and a flush abdominal aortogram was performed demarcating the origin of the IMA. Next, with the use of a 5 French angled glide catheter, the IMA was selected and a selective inferior mesenteric arteriogram was performed. With the use of a fathom 14 microwire, a regular Renegade microcatheter was advanced to the level of the trifurcation of the IMA and a sub selective inferior mesenteric arteriogram was performed. The microcatheter was then advanced to select the sigmoidal artery and a selective sigmoidal arteriogram was performed. The microcatheter was advanced into the distal arcade of the sigmoidal artery, beyond the location of the distal tributaries supplying the ill-defined area of contrast extravasation within the distal descending/proximal sigmoid colon. Contrast injection confirmed appropriate positioning and the short-segment of the distal arcade was percutaneously coil embolized with overlapping 2 mm diameter interlock coils. The microcatheter was retracted to the level of the sigmoidal artery and a post embolization sigmoidal arteriogram was performed. The microcatheter was then utilized to select the left colic artery and a selective left colic arteriogram was performed Images were reviewed and the procedure  was terminated. All wires, catheters and sheaths were removed from the patient. Hemostasis was achieved at the right groin access site with manual compression. The patient tolerated the procedure well without immediate post procedural complication. FINDINGS: Selective inferior mesenteric arteriogram demonstrates a conventional branching pattern and is negative for significant arterial supply to the distal descending/proximal sigmoid colon. No discrete areas of vessel irregularity or contrast extravasation are identified.  Flush abdominal aortogram demonstrates patency of the IMA. The abdominal aorta is noted to be tortuous but of normal caliber as are the bilateral common and internal iliac arteries. Selective inferior mesenteric arteriogram demonstrates a conventional branching pattern. A ill-defined area of contrast extravasation is seen at the level of the distal descending/proximal sigmoid colon, correlating with the findings on preceding CTA. Dominant arterial supply to this area of contrast extravasation is via the sigmoidal artery. Sub selective sigmoidal arteriogram confirms an ill-defined area of contrast extravasation supplied via a tiny tertiary branch from the distal arcade of the sigmoidal artery. The distal arcade was percutaneously coil embolized across the origin of the tiny tertiary branch supplying the ill-defined area of contrast extravasation. Post embolization selective arteriograms performed both from the level of the sigmoidal and left colic arteries are negative for persistent active extravasation. IMPRESSION: Technically successful percutaneous coil embolization of a distal arcade of the sigmoidal artery across the origin of a tiny tertiary branch supplying an ill-defined area of contrast extravasation at the level of the distal descending/proximal sigmoid colon, correlating with the findings seen on preceding CTA. PLAN: - The patient is to remain flat for 4 hours with right leg straight. - The patient will continue to experience several additional bloody bowel movements and may continue to require additional resuscitation (as she was bleeding both before and during the procedure), however ultimately I am hopeful she will stabilize in the coming days. - While presumably secondary to diverticular disease, repeat colonoscopy after the resolution of acute symptoms is advised to exclude the presence of an underlying mass/lesion. Electronically Signed   By: Sandi Mariscal M.D.   On: 04/24/2021 08:53   IR Angiogram  Selective Each Additional Vessel  Result Date: 04/24/2021 INDICATION: Acute lower GI bleeding. Positive CTA for active diverticular bleeding within the distal descending/proximal sigmoid colon. Please perform mesenteric arteriogram and percutaneous embolization as indicated. EXAM: 1. ULTRASOUND GUIDANCE FOR ARTERIAL ACCESS 2. SELECTIVE SUPERIOR MESENTERIC ARTERIOGRAM 3. FLUSH AORTOGRAM 4. SELECTIVE INFERIOR MESENTERIC ARTERIOGRAM 5. COMPARISON:  CTA abdomen and pelvis - earlier same day MEDICATIONS: None ANESTHESIA/SEDATION: Moderate (conscious) sedation was employed during this procedure as administered by the Interventional Radiology RN. A total of Versed 4 mg and Fentanyl 100 mcg was administered intravenously. Moderate Sedation Time: 60 minutes. The patient's level of consciousness and vital signs were monitored continuously by radiology nursing throughout the procedure under my direct supervision. CONTRAST:  60 cc Omnipaque 300 FLUOROSCOPY TIME:  19.6 minutes (633 mGy) COMPLICATIONS: None immediate. PROCEDURE: Informed consent was obtained from the patient following explanation of the procedure, risks, benefits and alternatives. All questions were addressed. A time out was performed prior to the initiation of the procedure. Maximal barrier sterile technique utilized including caps, mask, sterile gowns, sterile gloves, large sterile drape, hand hygiene, and Betadine prep. The right femoral head was marked fluoroscopically. Under sterile conditions and local anesthesia, the right common femoral artery access was performed with a micropuncture needle. Under direct ultrasound guidance, the right common femoral was accessed with a micropuncture kit. An ultrasound image was saved for documentation purposes. This  allowed for placement of a 5-French vascular sheath. A limited arteriogram was performed through the side arm of the sheath confirming appropriate access within the right common femoral artery. Over a  Bentson wire, a Mickelson catheter was advanced the caudal aspect of the thoracic aorta where was reformed, back bled and flushed. The Mickelson catheter was then utilized to select the superior mesenteric artery and a selective superior mesenteric arteriogram was performed. Next, the Mickelson catheter was utilized to attempt to cannulate the inferior mesenteric artery however this proved challenging given tortuosity of the abdominal aorta. As such, over Bentson wire, the Mickelson catheter was exchanged for an Omni Flush catheter and a flush abdominal aortogram was performed demarcating the origin of the IMA. Next, with the use of a 5 French angled glide catheter, the IMA was selected and a selective inferior mesenteric arteriogram was performed. With the use of a fathom 14 microwire, a regular Renegade microcatheter was advanced to the level of the trifurcation of the IMA and a sub selective inferior mesenteric arteriogram was performed. The microcatheter was then advanced to select the sigmoidal artery and a selective sigmoidal arteriogram was performed. The microcatheter was advanced into the distal arcade of the sigmoidal artery, beyond the location of the distal tributaries supplying the ill-defined area of contrast extravasation within the distal descending/proximal sigmoid colon. Contrast injection confirmed appropriate positioning and the short-segment of the distal arcade was percutaneously coil embolized with overlapping 2 mm diameter interlock coils. The microcatheter was retracted to the level of the sigmoidal artery and a post embolization sigmoidal arteriogram was performed. The microcatheter was then utilized to select the left colic artery and a selective left colic arteriogram was performed Images were reviewed and the procedure was terminated. All wires, catheters and sheaths were removed from the patient. Hemostasis was achieved at the right groin access site with manual compression. The patient  tolerated the procedure well without immediate post procedural complication. FINDINGS: Selective inferior mesenteric arteriogram demonstrates a conventional branching pattern and is negative for significant arterial supply to the distal descending/proximal sigmoid colon. No discrete areas of vessel irregularity or contrast extravasation are identified. Flush abdominal aortogram demonstrates patency of the IMA. The abdominal aorta is noted to be tortuous but of normal caliber as are the bilateral common and internal iliac arteries. Selective inferior mesenteric arteriogram demonstrates a conventional branching pattern. A ill-defined area of contrast extravasation is seen at the level of the distal descending/proximal sigmoid colon, correlating with the findings on preceding CTA. Dominant arterial supply to this area of contrast extravasation is via the sigmoidal artery. Sub selective sigmoidal arteriogram confirms an ill-defined area of contrast extravasation supplied via a tiny tertiary branch from the distal arcade of the sigmoidal artery. The distal arcade was percutaneously coil embolized across the origin of the tiny tertiary branch supplying the ill-defined area of contrast extravasation. Post embolization selective arteriograms performed both from the level of the sigmoidal and left colic arteries are negative for persistent active extravasation. IMPRESSION: Technically successful percutaneous coil embolization of a distal arcade of the sigmoidal artery across the origin of a tiny tertiary branch supplying an ill-defined area of contrast extravasation at the level of the distal descending/proximal sigmoid colon, correlating with the findings seen on preceding CTA. PLAN: - The patient is to remain flat for 4 hours with right leg straight. - The patient will continue to experience several additional bloody bowel movements and may continue to require additional resuscitation (as she was bleeding both before and  during  the procedure), however ultimately I am hopeful she will stabilize in the coming days. - While presumably secondary to diverticular disease, repeat colonoscopy after the resolution of acute symptoms is advised to exclude the presence of an underlying mass/lesion. Electronically Signed   By: Sandi Mariscal M.D.   On: 04/24/2021 08:53   IR US Guide Vasc Access Right  Result Date: 04/24/2021 INDICATION: Acute lower GI bleeding. Positive CTA for active diverticular bleeding within the distal descending/proximal sigmoid colon. Please perform mesenteric arteriogram and percutaneous embolization as indicated. EXAM: 1. ULTRASOUND GUIDANCE FOR ARTERIAL ACCESS 2. SELECTIVE SUPERIOR MESENTERIC ARTERIOGRAM 3. FLUSH AORTOGRAM 4. SELECTIVE INFERIOR MESENTERIC ARTERIOGRAM 5. COMPARISON:  CTA abdomen and pelvis - earlier same day MEDICATIONS: None ANESTHESIA/SEDATION: Moderate (conscious) sedation was employed during this procedure as administered by the Interventional Radiology RN. A total of Versed 4 mg and Fentanyl 100 mcg was administered intravenously. Moderate Sedation Time: 60 minutes. The patient's level of consciousness and vital signs were monitored continuously by radiology nursing throughout the procedure under my direct supervision. CONTRAST:  60 cc Omnipaque 300 FLUOROSCOPY TIME:  19.6 minutes (250 mGy) COMPLICATIONS: None immediate. PROCEDURE: Informed consent was obtained from the patient following explanation of the procedure, risks, benefits and alternatives. All questions were addressed. A time out was performed prior to the initiation of the procedure. Maximal barrier sterile technique utilized including caps, mask, sterile gowns, sterile gloves, large sterile drape, hand hygiene, and Betadine prep. The right femoral head was marked fluoroscopically. Under sterile conditions and local anesthesia, the right common femoral artery access was performed with a micropuncture needle. Under direct ultrasound  guidance, the right common femoral was accessed with a micropuncture kit. An ultrasound image was saved for documentation purposes. This allowed for placement of a 5-French vascular sheath. A limited arteriogram was performed through the side arm of the sheath confirming appropriate access within the right common femoral artery. Over a Bentson wire, a Mickelson catheter was advanced the caudal aspect of the thoracic aorta where was reformed, back bled and flushed. The Mickelson catheter was then utilized to select the superior mesenteric artery and a selective superior mesenteric arteriogram was performed. Next, the Mickelson catheter was utilized to attempt to cannulate the inferior mesenteric artery however this proved challenging given tortuosity of the abdominal aorta. As such, over Bentson wire, the Mickelson catheter was exchanged for an Omni Flush catheter and a flush abdominal aortogram was performed demarcating the origin of the IMA. Next, with the use of a 5 French angled glide catheter, the IMA was selected and a selective inferior mesenteric arteriogram was performed. With the use of a fathom 14 microwire, a regular Renegade microcatheter was advanced to the level of the trifurcation of the IMA and a sub selective inferior mesenteric arteriogram was performed. The microcatheter was then advanced to select the sigmoidal artery and a selective sigmoidal arteriogram was performed. The microcatheter was advanced into the distal arcade of the sigmoidal artery, beyond the location of the distal tributaries supplying the ill-defined area of contrast extravasation within the distal descending/proximal sigmoid colon. Contrast injection confirmed appropriate positioning and the short-segment of the distal arcade was percutaneously coil embolized with overlapping 2 mm diameter interlock coils. The microcatheter was retracted to the level of the sigmoidal artery and a post embolization sigmoidal arteriogram was  performed. The microcatheter was then utilized to select the left colic artery and a selective left colic arteriogram was performed Images were reviewed and the procedure was terminated. All wires, catheters and sheaths  were removed from the patient. Hemostasis was achieved at the right groin access site with manual compression. The patient tolerated the procedure well without immediate post procedural complication. FINDINGS: Selective inferior mesenteric arteriogram demonstrates a conventional branching pattern and is negative for significant arterial supply to the distal descending/proximal sigmoid colon. No discrete areas of vessel irregularity or contrast extravasation are identified. Flush abdominal aortogram demonstrates patency of the IMA. The abdominal aorta is noted to be tortuous but of normal caliber as are the bilateral common and internal iliac arteries. Selective inferior mesenteric arteriogram demonstrates a conventional branching pattern. A ill-defined area of contrast extravasation is seen at the level of the distal descending/proximal sigmoid colon, correlating with the findings on preceding CTA. Dominant arterial supply to this area of contrast extravasation is via the sigmoidal artery. Sub selective sigmoidal arteriogram confirms an ill-defined area of contrast extravasation supplied via a tiny tertiary branch from the distal arcade of the sigmoidal artery. The distal arcade was percutaneously coil embolized across the origin of the tiny tertiary branch supplying the ill-defined area of contrast extravasation. Post embolization selective arteriograms performed both from the level of the sigmoidal and left colic arteries are negative for persistent active extravasation. IMPRESSION: Technically successful percutaneous coil embolization of a distal arcade of the sigmoidal artery across the origin of a tiny tertiary branch supplying an ill-defined area of contrast extravasation at the level of the  distal descending/proximal sigmoid colon, correlating with the findings seen on preceding CTA. PLAN: - The patient is to remain flat for 4 hours with right leg straight. - The patient will continue to experience several additional bloody bowel movements and may continue to require additional resuscitation (as she was bleeding both before and during the procedure), however ultimately I am hopeful she will stabilize in the coming days. - While presumably secondary to diverticular disease, repeat colonoscopy after the resolution of acute symptoms is advised to exclude the presence of an underlying mass/lesion. Electronically Signed   By: Sandi Mariscal M.D.   On: 04/24/2021 08:53   ECHOCARDIOGRAM COMPLETE  Result Date: 04/25/2021    ECHOCARDIOGRAM REPORT   Patient Name:   ANGELL PINCOCK Call[place]ott Date of Exam: 04/25/2021 Medical Rec #:  [id number]           Height:       66.0 in Accession #:    [id number]          Weight:       173.7 lb Date of Birth:  [birth year]           BSA:          1.884 m Patient Age:    84 years            BP:           143/80 mmHg Patient Gender: F                   HR:           82 bpm. Exam Location:  Inpatient Procedure: 2D Echo, 3D Echo, Cardiac Doppler and Color Doppler Indications:    R55 Syncope  History:        Patient has no prior history of Echocardiogram examinations.                 Risk Factors:Hypertension and Dyslipidemia. GI bleed.  Sonographer:    Roseanna Rainbow RDCS Referring Phys: [place]  1. Left ventricular ejection fraction, by estimation, is 55 to  60%. The left ventricle has normal function. The left ventricle has no regional wall motion abnormalities. There is moderate left ventricular hypertrophy. 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  2. Right ventricular systolic function is normal. The right ventricular size is normal.  3. The mitral valve is abnormal. Trivial mitral valve regurgitation.  4. The aortic valve is tricuspid. Aortic valve  regurgitation is not visualized. Aortic valve sclerosis is present, with no evidence of aortic valve stenosis. FINDINGS  Left Ventricle: Left ventricular ejection fraction, by estimation, is 55 to 60%. The left ventricle has normal function. The left ventricle has no regional wall motion abnormalities. The left ventricular internal cavity size was small. There is moderate  left ventricular hypertrophy. Indeterminate diastolic filling due to E-A fusion. Right Ventricle: The right ventricular size is normal. Right vetricular wall thickness was not assessed. Right ventricular systolic function is normal. Left Atrium: Left atrial size was normal in size. Right Atrium: Right atrial size was normal in size. Pericardium: There is no evidence of pericardial effusion. Mitral Valve: The mitral valve is abnormal. There is mild thickening of the mitral valve leaflet(s). Mild mitral annular calcification. Trivial mitral valve regurgitation. Tricuspid Valve: The tricuspid valve is normal in structure. Tricuspid valve regurgitation is trivial. Aortic Valve: The aortic valve is tricuspid. Aortic valve regurgitation is not visualized. Aortic valve sclerosis is present, with no evidence of aortic valve stenosis. Aortic valve mean gradient measures 8.0 mmHg. Aortic valve peak gradient measures 15.8 mmHg. Aortic valve area, by VTI measures 2.26 cm. Pulmonic Valve: The pulmonic valve was not well visualized. Pulmonic valve regurgitation is not visualized. Aorta: The aortic root is normal in size and structure. IAS/Shunts: No atrial level shunt detected by color flow Doppler.  LEFT VENTRICLE PLAX 2D LVIDd:         3.20 cm     Diastology LVIDs:         2.35 cm     LV e' medial:    6.09 cm/s LV PW:         1.20 cm     LV E/e' medial:  14.9 LV IVS:        1.50 cm     LV e' lateral:   6.20 cm/s LVOT diam:     2.00 cm     LV E/e' lateral: 14.6 LV SV:         82 LV SV Index:   44 LVOT Area:     3.14 cm                             3D Volume  EF: LV Volumes (MOD)           3D EF:        59 % LV vol d, MOD A2C: 67.0 ml LV EDV:       99 ml LV vol d, MOD A4C: 61.4 ml LV ESV:       40 ml LV vol s, MOD A2C: 24.2 ml LV SV:        59 ml LV vol s, MOD A4C: 22.0 ml LV SV MOD A2C:     42.8 ml LV SV MOD A4C:     61.4 ml LV SV MOD BP:      42.3 ml RIGHT VENTRICLE             IVC RV S prime:     18.50 cm/s  IVC diam:  1.40 cm TAPSE (M-mode): 2.7 cm LEFT ATRIUM             Index        RIGHT ATRIUM           Index LA diam:        3.30 cm 1.75 cm/m   RA Area:     12.60 cm LA Vol (A2C):   55.2 ml 29.31 ml/m  RA Volume:   29.90 ml  15.87 ml/m LA Vol (A4C):   37.5 ml 19.91 ml/m LA Biplane Vol: 44.9 ml 23.84 ml/m  AORTIC VALVE AV Area (Vmax):    2.48 cm AV Area (Vmean):   2.38 cm AV Area (VTI):     2.26 cm AV Vmax:           198.67 cm/s AV Vmean:          129.333 cm/s AV VTI:            0.363 m AV Peak Grad:      15.8 mmHg AV Mean Grad:      8.0 mmHg LVOT Vmax:         157.00 cm/s LVOT Vmean:        98.100 cm/s LVOT VTI:          0.261 m LVOT/AV VTI ratio: 0.72  AORTA Ao Root diam: 3.20 cm Ao Asc diam:  3.70 cm MITRAL VALVE MV Area (PHT): 2.07 cm     SHUNTS MV Decel Time: 366 msec     Systemic VTI:  0.26 m MV E velocity: 90.60 cm/s   Systemic Diam: 2.00 cm MV A velocity: 118.00 cm/s MV E/A ratio:  0.77 Dorris Carnes MD Electronically signed by Dorris Carnes MD Signature Date/Time: 04/25/2021/1:41:35 PM    Final    IR EMBO ARTERIAL NOT HEMORR HEMANG INC GUIDE ROADMAPPING  Result Date: 04/24/2021 INDICATION: Acute lower GI bleeding. Positive CTA for active diverticular bleeding within the distal descending/proximal sigmoid colon. Please perform mesenteric arteriogram and percutaneous embolization as indicated. EXAM: 1. ULTRASOUND GUIDANCE FOR ARTERIAL ACCESS 2. SELECTIVE SUPERIOR MESENTERIC ARTERIOGRAM 3. FLUSH AORTOGRAM 4. SELECTIVE INFERIOR MESENTERIC ARTERIOGRAM 5. COMPARISON:  CTA abdomen and pelvis - earlier same day MEDICATIONS: None ANESTHESIA/SEDATION:  Moderate (conscious) sedation was employed during this procedure as administered by the Interventional Radiology RN. A total of Versed 4 mg and Fentanyl 100 mcg was administered intravenously. Moderate Sedation Time: 60 minutes. The patient's level of consciousness and vital signs were monitored continuously by radiology nursing throughout the procedure under my direct supervision. CONTRAST:  60 cc Omnipaque 300 FLUOROSCOPY TIME:  19.6 minutes (741 mGy) COMPLICATIONS: None immediate. PROCEDURE: Informed consent was obtained from the patient following explanation of the procedure, risks, benefits and alternatives. All questions were addressed. A time out was performed prior to the initiation of the procedure. Maximal barrier sterile technique utilized including caps, mask, sterile gowns, sterile gloves, large sterile drape, hand hygiene, and Betadine prep. The right femoral head was marked fluoroscopically. Under sterile conditions and local anesthesia, the right common femoral artery access was performed with a micropuncture needle. Under direct ultrasound guidance, the right common femoral was accessed with a micropuncture kit. An ultrasound image was saved for documentation purposes. This allowed for placement of a 5-French vascular sheath. A limited arteriogram was performed through the side arm of the sheath confirming appropriate access within the right common femoral artery. Over a Bentson wire, a Mickelson catheter was advanced the caudal aspect of the thoracic aorta where was  reformed, back bled and flushed. The Mickelson catheter was then utilized to select the superior mesenteric artery and a selective superior mesenteric arteriogram was performed. Next, the Mickelson catheter was utilized to attempt to cannulate the inferior mesenteric artery however this proved challenging given tortuosity of the abdominal aorta. As such, over Bentson wire, the Mickelson catheter was exchanged for an Omni Flush catheter  and a flush abdominal aortogram was performed demarcating the origin of the IMA. Next, with the use of a 5 French angled glide catheter, the IMA was selected and a selective inferior mesenteric arteriogram was performed. With the use of a fathom 14 microwire, a regular Renegade microcatheter was advanced to the level of the trifurcation of the IMA and a sub selective inferior mesenteric arteriogram was performed. The microcatheter was then advanced to select the sigmoidal artery and a selective sigmoidal arteriogram was performed. The microcatheter was advanced into the distal arcade of the sigmoidal artery, beyond the location of the distal tributaries supplying the ill-defined area of contrast extravasation within the distal descending/proximal sigmoid colon. Contrast injection confirmed appropriate positioning and the short-segment of the distal arcade was percutaneously coil embolized with overlapping 2 mm diameter interlock coils. The microcatheter was retracted to the level of the sigmoidal artery and a post embolization sigmoidal arteriogram was performed. The microcatheter was then utilized to select the left colic artery and a selective left colic arteriogram was performed Images were reviewed and the procedure was terminated. All wires, catheters and sheaths were removed from the patient. Hemostasis was achieved at the right groin access site with manual compression. The patient tolerated the procedure well without immediate post procedural complication. FINDINGS: Selective inferior mesenteric arteriogram demonstrates a conventional branching pattern and is negative for significant arterial supply to the distal descending/proximal sigmoid colon. No discrete areas of vessel irregularity or contrast extravasation are identified. Flush abdominal aortogram demonstrates patency of the IMA. The abdominal aorta is noted to be tortuous but of normal caliber as are the bilateral common and internal iliac arteries.  Selective inferior mesenteric arteriogram demonstrates a conventional branching pattern. A ill-defined area of contrast extravasation is seen at the level of the distal descending/proximal sigmoid colon, correlating with the findings on preceding CTA. Dominant arterial supply to this area of contrast extravasation is via the sigmoidal artery. Sub selective sigmoidal arteriogram confirms an ill-defined area of contrast extravasation supplied via a tiny tertiary branch from the distal arcade of the sigmoidal artery. The distal arcade was percutaneously coil embolized across the origin of the tiny tertiary branch supplying the ill-defined area of contrast extravasation. Post embolization selective arteriograms performed both from the level of the sigmoidal and left colic arteries are negative for persistent active extravasation. IMPRESSION: Technically successful percutaneous coil embolization of a distal arcade of the sigmoidal artery across the origin of a tiny tertiary branch supplying an ill-defined area of contrast extravasation at the level of the distal descending/proximal sigmoid colon, correlating with the findings seen on preceding CTA. PLAN: - The patient is to remain flat for 4 hours with right leg straight. - The patient will continue to experience several additional bloody bowel movements and may continue to require additional resuscitation (as she was bleeding both before and during the procedure), however ultimately I am hopeful she will stabilize in the coming days. - While presumably secondary to diverticular disease, repeat colonoscopy after the resolution of acute symptoms is advised to exclude the presence of an underlying mass/lesion. Electronically Signed   By: Eldridge Abrahams.D.  On: 04/24/2021 08:53   VAS [place] LOWER EXTREMITY VENOUS (DVT)  Result Date: 04/25/2021  Lower Venous DVT Study Patient Name:  CLEDITH KAMIYA Ramson  Date of Exam:   04/25/2021 Medical Rec #: [id number]             Accession #:    [id number] Date of Birth: 31-Dec-1945            Patient Gender: F Patient Age:   14 years Exam Location:  [facility] Procedure:      VAS [place] LOWER EXTREMITY VENOUS (DVT) Referring Phys: Oren Binet --------------------------------------------------------------------------------  Indications: Pulmonary embolism.  Comparison Study: no prior Performing Technologist: Archie Patten RVS  Examination Guidelines: A complete evaluation includes B-mode imaging, spectral Doppler, color Doppler, and power Doppler as needed of all accessible portions of each vessel. Bilateral testing is considered an integral part of a complete examination. Limited examinations for reoccurring indications may be performed as noted. The reflux portion of the exam is performed with the patient in reverse Trendelenburg.  +---------+---------------+---------+-----------+----------+-----------------+ RIGHT    CompressibilityPhasicitySpontaneityPropertiesThrombus Aging    +---------+---------------+---------+-----------+----------+-----------------+ CFV      Full           Yes      Yes                                    +---------+---------------+---------+-----------+----------+-----------------+ SFJ      Full                                                           +---------+---------------+---------+-----------+----------+-----------------+ FV Prox  Full                                                           +---------+---------------+---------+-----------+----------+-----------------+ FV Mid   Full                                                           +---------+---------------+---------+-----------+----------+-----------------+ FV DistalFull                                                           +---------+---------------+---------+-----------+----------+-----------------+ PFV      Full                                                            +---------+---------------+---------+-----------+----------+-----------------+ POP      Full           Yes      Yes                                    +---------+---------------+---------+-----------+----------+-----------------+  PTV      Full                                                           +---------+---------------+---------+-----------+----------+-----------------+ PERO     None                                         Age Indeterminate +---------+---------------+---------+-----------+----------+-----------------+   +---------+---------------+---------+-----------+----------+--------------+ LEFT     CompressibilityPhasicitySpontaneityPropertiesThrombus Aging +---------+---------------+---------+-----------+----------+--------------+ CFV      Full           Yes      Yes                                 +---------+---------------+---------+-----------+----------+--------------+ SFJ      Full                                                        +---------+---------------+---------+-----------+----------+--------------+ FV Prox  Full                                                        +---------+---------------+---------+-----------+----------+--------------+ FV Mid   Full                                                        +---------+---------------+---------+-----------+----------+--------------+ FV DistalFull                                                        +---------+---------------+---------+-----------+----------+--------------+ PFV      Full                                                        +---------+---------------+---------+-----------+----------+--------------+ POP      Full           Yes      Yes                                 +---------+---------------+---------+-----------+----------+--------------+ PTV      Full                                                         +---------+---------------+---------+-----------+----------+--------------+  PERO     Full                                                        +---------+---------------+---------+-----------+----------+--------------+     Summary: RIGHT: - Findings consistent with age indeterminate deep vein thrombosis involving the right peroneal veins. - No cystic structure found in the popliteal fossa.  LEFT: - There is no evidence of deep vein thrombosis in the lower extremity.  - No cystic structure found in the popliteal fossa.  *See table(s) above for measurements and observations. Electronically signed by Monica Martinez MD on 04/25/2021 at 4:42:05 PM.    Final    CT Angio Abd/Pel w/ and/or w/o  Result Date: 04/23/2021 CLINICAL DATA:  GI bleed, bright red blood per rectum EXAM: CTA ABDOMEN AND PELVIS WITHOUT AND WITH CONTRAST TECHNIQUE: Multidetector CT imaging of the abdomen and pelvis was performed using the standard protocol during bolus administration of intravenous contrast. Multiplanar reconstructed images and MIPs were obtained and reviewed to evaluate the vascular anatomy. CONTRAST:  139mL OMNIPAQUE IOHEXOL 350 MG/ML SOLN IV COMPARISON:  None FINDINGS: VASCULAR Aorta: Atherosclerotic calcifications of tortuous abdominal aorta without aneurysm. No intramural hematoma on precontrast imaging. No dissection. No significant thrombus. Celiac: Mild plaque at origin.  No significant stenosis. SMA: Widely patent Renals: Plaque formation at BILATERAL renal artery origins, not significant on LEFT, estimated 50% on RIGHT. IMA: Patent origin Inflow: Atherosclerotic calcifications and tortuosity of common iliac arteries. Additional calcified plaque within internal iliac arteries bilaterally. Proximal Outflow: Mild plaque at common femoral arteries and bifurcations Veins: Patent Review of the MIP images confirms the above findings. NON-VASCULAR Lower chest: Lung bases clear Hepatobiliary: Cyst at central liver 2.2  x 1.7 cm series 6, image 16. Gallbladder and liver otherwise unremarkable. Pancreas: Normal appearance Spleen: Normal appearance Adrenals/Urinary Tract: Tiny BILATERAL renal cysts. Adrenal glands, kidneys, ureters, and bladder otherwise normal appearance. Stomach/Bowel: Diffuse diverticulosis of descending and sigmoid colon. Active site of IV contrast extravasation identified at the distal descending colon near the descending sigmoid junction, where a single diverticulum which is low attenuation on precontrast imaging demonstrates high attenuation postcontrast, and delayed images showing passage of extravasated blood into the colonic lumen. Findings consistent with a site of active GI bleeding. No additional sites of abnormal contrast extravasation identified. Stomach and remaining bowel loops unremarkable. Lymphatic: No adenopathy Reproductive: Unremarkable uterus and ovaries Other: No free air or free fluid.  No hernia. Musculoskeletal: Diffuse osseous demineralization. IMPRESSION: VASCULAR Scattered atherosclerotic plaque formation without evidence of critical stenosis. 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 No evidence of aneurysm or dissection. NON-VASCULAR Active site of IV contrast extravasation at a diverticulum at the distal descending colon near the descending sigmoid junction with contrast extending in to the colonic lumen on delayed images consistent with active GI bleeding. Aortic Atherosclerosis (ICD10-I70.0). Findings discussed with Dr. Tarri Glenn on 04/23/2021 at 1943 hours. Electronically Signed   By: Lavonia Dana M.D.   On: 04/23/2021 19:51     TODAY-DAY OF DISCHARGE:  Subjective:   Bianco Schirmer today has no headache,no chest abdominal pain,no new weakness tingling or numbness, feels much better wants to go home today.   Objective:   Blood pressure 131/78, pulse 78, temperature 98.6 F (37 C), temperature source Oral, resp. rate (!) 23, height $RemoveBe'5\' 6"'JRWVaIyWG$  (1.676 m), weight  78.8 kg,  SpO2 95 %.  Intake/Output Summary (Last 24 hours) at 04/26/2021 1159 Last data filed at 04/26/2021 0839 Gross per 24 hour  Intake 558.24 ml  Output 400 ml  Net 158.24 ml   Filed Weights   04/22/21 2022 04/23/21 0500 04/24/21 0500  Weight: 76.2 kg 82.4 kg 78.8 kg    Exam: Awake Alert, Oriented *3, No new F.N deficits, Normal affect [place].AT,PERRAL Supple Neck,No JVD, No cervical lymphadenopathy appriciated.  Symmetrical Chest wall movement, Good air movement bilaterally, CTAB RRR,No Gallops,Rubs or new Murmurs, No Parasternal Heave +ve B.Sounds, Abd Soft, Non tender, No organomegaly appriciated, No rebound -guarding or rigidity. No Cyanosis, Clubbing or edema, No new Rash or bruise   PERTINENT RADIOLOGIC STUDIES: DG Ribs Unilateral Left  Result Date: 04/24/2021 CLINICAL DATA:  CPR, rib pain. EXAM: LEFT RIBS - 2 VIEW COMPARISON:  None. FINDINGS: The lungs are clear. There is no pleural effusion or pneumothorax. The cardiomediastinal silhouette is within normal limits. There are acute nondisplaced left anterior fifth, sixth and seventh rib fractures. No displaced rib fractures are identified. IMPRESSION: 1. Acute nondisplaced left fifth, sixth and seventh rib fractures. 2. No other acute cardiopulmonary process. Electronically Signed   By: Ronney Asters M.D.   On: 04/24/2021 21:16   DG Ribs Unilateral Right  Result Date: 04/24/2021 CLINICAL DATA:  Pt complains of bilateral rib pain. Pt states she received a single compression from CPR and that is when the pain started. No other injury noted. EXAM: RIGHT RIBS - 2 VIEW COMPARISON:  None. FINDINGS: No acute displaced fracture or other bone lesions are seen involving the right ribs. IMPRESSION: No acute displaced rib fracture. Please note, nondisplaced rib fractures may be occult on radiograph. Electronically Signed   By: Iven Finn M.D.   On: 04/24/2021 22:20   CT Angio Chest Pulmonary Embolism (PE) W or WO Contrast  Result Date:  04/25/2021 CLINICAL DATA:  PE suspected.  Low to intermediate probability. EXAM: CT ANGIOGRAPHY CHEST WITH CONTRAST TECHNIQUE: Multidetector CT imaging of the chest was performed using the standard protocol during bolus administration of intravenous contrast. Multiplanar CT image reconstructions and MIPs were obtained to evaluate the vascular anatomy. CONTRAST:  195mL OMNIPAQUE IOHEXOL 350 MG/ML SOLN COMPARISON:  Plain films of the ribs of 1 day prior. FINDINGS: Cardiovascular: The quality of this exam for evaluation of pulmonary embolism is sufficient. The bolus is suboptimally timed and there is minimal motion degradation. Right lower lobe segmental, nearly occlusive thrombus including on 309/8 and 92/9. Aortic atherosclerosis. Tortuous thoracic aorta. Normal heart size, without pericardial effusion. Lad coronary artery calcification. Mediastinum/Nodes: No mediastinal or hilar adenopathy. Contrast air level in the esophagus on 93/6. Lungs/Pleura: Trace right pleural fluid. 3 mm nodule on the right minor fissure on 78/7. 3-4 mm right middle lobe pulmonary nodule on 99/7. Upper Abdomen: Hepatic right hepatic lobe cysts. Normal imaged portions of the spleen, stomach, left adrenal gland. Musculoskeletal: No acute osseous abnormality. Review of the MIP images confirms the above findings. IMPRESSION: 1. Isolated right lower lobe segmental pulmonary embolism. 2. Trace right pleural fluid. 3. Coronary artery atherosclerosis. Aortic Atherosclerosis (ICD10-I70.0). 4. Esophageal air fluid level suggests dysmotility or gastroesophageal reflux. 5. Right-sided pulmonary nodules of up to 4 mm. No follow-up needed if patient is low-risk. Non-contrast chest CT can be considered in 12 months if patient is high-risk. This recommendation follows the consensus statement: Guidelines for Management of Incidental Pulmonary Nodules Detected on CT Images: From the Fleischner Society 2017; Radiology 2017; 284:228-243.  1. These results will  be called to the ordering clinician or representative by the Radiologist Assistant, and communication documented in the PACS or [employer]. Electronically Signed   By: Abigail Miyamoto M.D.   On: 04/25/2021 11:37   ECHOCARDIOGRAM COMPLETE  Result Date: 04/25/2021    ECHOCARDIOGRAM REPORT   Patient Name:   RENELDA KILIAN Mclouth Date of Exam: 04/25/2021 Medical Rec #:  [id number]           Height:       66.0 in Accession #:    [id number]          Weight:       173.7 lb Date of Birth:  [birth year]           BSA:          1.884 m Patient Age:    89 years            BP:           143/80 mmHg Patient Gender: F                   HR:           82 bpm. Exam Location:  Inpatient Procedure: 2D Echo, 3D Echo, Cardiac Doppler and Color Doppler Indications:    R55 Syncope  History:        Patient has no prior history of Echocardiogram examinations.                 Risk Factors:Hypertension and Dyslipidemia. GI bleed.  Sonographer:    Roseanna Rainbow RDCS Referring Phys: [place]  1. Left ventricular ejection fraction, by estimation, is 55 to 60%. The left ventricle has normal function. The left ventricle has no regional wall motion abnormalities. There is moderate left ventricular hypertrophy. 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  2. Right ventricular systolic function is normal. The right ventricular size is normal.  3. The mitral valve is abnormal. Trivial mitral valve regurgitation.  4. The aortic valve is tricuspid. Aortic valve regurgitation is not visualized. Aortic valve sclerosis is present, with no evidence of aortic valve stenosis. FINDINGS  Left Ventricle: Left ventricular ejection fraction, by estimation, is 55 to 60%. The left ventricle has normal function. The left ventricle has no regional wall motion abnormalities. The left ventricular internal cavity size was small. There is moderate  left ventricular hypertrophy. Indeterminate diastolic filling due to E-A fusion. Right  Ventricle: The right ventricular size is normal. Right vetricular wall thickness was not assessed. Right ventricular systolic function is normal. Left Atrium: Left atrial size was normal in size. Right Atrium: Right atrial size was normal in size. Pericardium: There is no evidence of pericardial effusion. Mitral Valve: The mitral valve is abnormal. There is mild thickening of the mitral valve leaflet(s). Mild mitral annular calcification. Trivial mitral valve regurgitation. Tricuspid Valve: The tricuspid valve is normal in structure. Tricuspid valve regurgitation is trivial. Aortic Valve: The aortic valve is tricuspid. Aortic valve regurgitation is not visualized. Aortic valve sclerosis is present, with no evidence of aortic valve stenosis. Aortic valve mean gradient measures 8.0 mmHg. Aortic valve peak gradient measures 15.8 mmHg. Aortic valve area, by VTI measures 2.26 cm. Pulmonic Valve: The pulmonic valve was not well visualized. Pulmonic valve regurgitation is not visualized. Aorta: The aortic root is normal in size and structure. IAS/Shunts: No atrial level shunt detected by color flow Doppler.  LEFT VENTRICLE PLAX 2D LVIDd:  3.20 cm     Diastology LVIDs:         2.35 cm     LV e' medial:    6.09 cm/s LV PW:         1.20 cm     LV E/e' medial:  14.9 LV IVS:        1.50 cm     LV e' lateral:   6.20 cm/s LVOT diam:     2.00 cm     LV E/e' lateral: 14.6 LV SV:         82 LV SV Index:   44 LVOT Area:     3.14 cm                             3D Volume EF: LV Volumes (MOD)           3D EF:        59 % LV vol d, MOD A2C: 67.0 ml LV EDV:       99 ml LV vol d, MOD A4C: 61.4 ml LV ESV:       40 ml LV vol s, MOD A2C: 24.2 ml LV SV:        59 ml LV vol s, MOD A4C: 22.0 ml LV SV MOD A2C:     42.8 ml LV SV MOD A4C:     61.4 ml LV SV MOD BP:      42.3 ml RIGHT VENTRICLE             IVC RV S prime:     18.50 cm/s  IVC diam: 1.40 cm TAPSE (M-mode): 2.7 cm LEFT ATRIUM             Index        RIGHT ATRIUM            Index LA diam:        3.30 cm 1.75 cm/m   RA Area:     12.60 cm LA Vol (A2C):   55.2 ml 29.31 ml/m  RA Volume:   29.90 ml  15.87 ml/m LA Vol (A4C):   37.5 ml 19.91 ml/m LA Biplane Vol: 44.9 ml 23.84 ml/m  AORTIC VALVE AV Area (Vmax):    2.48 cm AV Area (Vmean):   2.38 cm AV Area (VTI):     2.26 cm AV Vmax:           198.67 cm/s AV Vmean:          129.333 cm/s AV VTI:            0.363 m AV Peak Grad:      15.8 mmHg AV Mean Grad:      8.0 mmHg LVOT Vmax:         157.00 cm/s LVOT Vmean:        98.100 cm/s LVOT VTI:          0.261 m LVOT/AV VTI ratio: 0.72  AORTA Ao Root diam: 3.20 cm Ao Asc diam:  3.70 cm MITRAL VALVE MV Area (PHT): 2.07 cm     SHUNTS MV Decel Time: 366 msec     Systemic VTI:  0.26 m MV E velocity: 90.60 cm/s   Systemic Diam: 2.00 cm MV A velocity: 118.00 cm/s MV E/A ratio:  0.77 Dorris Carnes MD Electronically signed by Dorris Carnes MD Signature Date/Time: 04/25/2021/1:41:35 PM    Final    VAS [place] LOWER EXTREMITY VENOUS (  DVT)  Result Date: 04/25/2021  Lower Venous DVT Study Patient Name:  MARNA WENIGER Rendleman  Date of Exam:   04/25/2021 Medical Rec #: [id number]            Accession #:    [id number] Date of Birth: 04-Jun-1945            Patient Gender: F Patient Age:   51 years Exam Location:  [facility] Procedure:      VAS [place] LOWER EXTREMITY VENOUS (DVT) Referring Phys: Oren Binet --------------------------------------------------------------------------------  Indications: Pulmonary embolism.  Comparison Study: no prior Performing Technologist: Archie Patten RVS  Examination Guidelines: A complete evaluation includes B-mode imaging, spectral Doppler, color Doppler, and power Doppler as needed of all accessible portions of each vessel. Bilateral testing is considered an integral part of a complete examination. Limited examinations for reoccurring indications may be performed as noted. The reflux portion of the exam is performed with the patient in reverse Trendelenburg.   +---------+---------------+---------+-----------+----------+-----------------+ RIGHT    CompressibilityPhasicitySpontaneityPropertiesThrombus Aging    +---------+---------------+---------+-----------+----------+-----------------+ CFV      Full           Yes      Yes                                    +---------+---------------+---------+-----------+----------+-----------------+ SFJ      Full                                                           +---------+---------------+---------+-----------+----------+-----------------+ FV Prox  Full                                                           +---------+---------------+---------+-----------+----------+-----------------+ FV Mid   Full                                                           +---------+---------------+---------+-----------+----------+-----------------+ FV DistalFull                                                           +---------+---------------+---------+-----------+----------+-----------------+ PFV      Full                                                           +---------+---------------+---------+-----------+----------+-----------------+ POP      Full           Yes      Yes                                    +---------+---------------+---------+-----------+----------+-----------------+  PTV      Full                                                           +---------+---------------+---------+-----------+----------+-----------------+ PERO     None                                         Age Indeterminate +---------+---------------+---------+-----------+----------+-----------------+   +---------+---------------+---------+-----------+----------+--------------+ LEFT     CompressibilityPhasicitySpontaneityPropertiesThrombus Aging +---------+---------------+---------+-----------+----------+--------------+ CFV      Full           Yes      Yes                                  +---------+---------------+---------+-----------+----------+--------------+ SFJ      Full                                                        +---------+---------------+---------+-----------+----------+--------------+ FV Prox  Full                                                        +---------+---------------+---------+-----------+----------+--------------+ FV Mid   Full                                                        +---------+---------------+---------+-----------+----------+--------------+ FV DistalFull                                                        +---------+---------------+---------+-----------+----------+--------------+ PFV      Full                                                        +---------+---------------+---------+-----------+----------+--------------+ POP      Full           Yes      Yes                                 +---------+---------------+---------+-----------+----------+--------------+ PTV      Full                                                        +---------+---------------+---------+-----------+----------+--------------+  PERO     Full                                                        +---------+---------------+---------+-----------+----------+--------------+     Summary: RIGHT: - Findings consistent with age indeterminate deep vein thrombosis involving the right peroneal veins. - No cystic structure found in the popliteal fossa.  LEFT: - There is no evidence of deep vein thrombosis in the lower extremity.  - No cystic structure found in the popliteal fossa.  *See table(s) above for measurements and observations. Electronically signed by Monica Martinez MD on 04/25/2021 at 4:42:05 PM.    Final      PERTINENT LAB RESULTS: CBC: Recent Labs    04/26/21 0302 04/26/21 0544  WBC 10.2 9.8  HGB 9.9* 10.0*  HCT 30.8* 30.3*  PLT 176 184   CMET CMP     Component Value Date/Time    NA 138 04/25/2021 0713   K 3.3 (L) 04/25/2021 0713   CL 107 04/25/2021 0713   CO2 24 04/25/2021 0713   GLUCOSE 104 (H) 04/25/2021 0713   BUN 13 04/25/2021 0713   CREATININE 0.54 04/25/2021 0713   CALCIUM 8.3 (L) 04/25/2021 0713   PROT 5.3 (L) 04/25/2021 0713   ALBUMIN 2.9 (L) 04/25/2021 0713   AST 16 04/25/2021 0713   ALT 15 04/25/2021 0713   ALKPHOS 64 04/25/2021 0713   BILITOT 1.0 04/25/2021 0713   GFRNONAA >60 04/25/2021 0713    GFR Estimated Creatinine Clearance: 63.4 mL/min (by C-G formula based on SCr of 0.54 mg/dL). No results for input(s): LIPASE, AMYLASE in the last 72 hours. No results for input(s): CKTOTAL, CKMB, CKMBINDEX, TROPONINI in the last 72 hours. Invalid input(s): POCBNP No results for input(s): DDIMER in the last 72 hours. No results for input(s): HGBA1C in the last 72 hours. No results for input(s): CHOL, HDL, LDLCALC, TRIG, CHOLHDL, LDLDIRECT in the last 72 hours. No results for input(s): TSH, T4TOTAL, T3FREE, THYROIDAB in the last 72 hours.  Invalid input(s): FREET3 No results for input(s): VITAMINB12, FOLATE, FERRITIN, TIBC, IRON, RETICCTPCT in the last 72 hours. Coags: No results for input(s): INR in the last 72 hours.  Invalid input(s): PT Microbiology: Recent Results (from the past 240 hour(s))  Resp Panel by RT-PCR (Flu A&B, Covid) Nasopharyngeal Swab     Status: None   Collection Time: 04/22/21 11:16 PM   Specimen: Nasopharyngeal Swab; Nasopharyngeal(NP) swabs in vial transport medium  Result Value Ref Range Status   SARS Coronavirus 2 by RT PCR NEGATIVE NEGATIVE Final    Comment: (NOTE) SARS-CoV-2 target nucleic acids are NOT DETECTED.  The SARS-CoV-2 RNA is generally detectable in upper respiratory specimens during the acute phase of infection. The lowest concentration of SARS-CoV-2 viral copies this assay can detect is 138 copies/mL. A negative result does not preclude SARS-Cov-2 infection and should not be used as the sole basis for  treatment or other patient management decisions. A negative result may occur with  improper specimen collection/handling, submission of specimen other than nasopharyngeal swab, presence of viral mutation(s) within the areas targeted by this assay, and inadequate number of viral copies(<138 copies/mL). A negative result must be combined with clinical observations, patient history, and epidemiological information. The expected result is Negative.  Fact Sheet for Patients:  EntrepreneurPulse.com.au  Fact Sheet for Healthcare  Providers:  SeriousBroker.it  This test is no t yet approved or cleared by the [place] and  has been authorized for detection and/or diagnosis of SARS-CoV-2 by FDA under an Emergency Use Authorization (EUA). This EUA will remain  in effect (meaning this test can be used) for the duration of the COVID-19 declaration under Section 564(b)(1) of the Act, 21 U.S.C.section 360bbb-3(b)(1), unless the authorization is terminated  or revoked sooner.       Influenza A by PCR NEGATIVE NEGATIVE Final   Influenza B by PCR NEGATIVE NEGATIVE Final    Comment: (NOTE) The Xpert Xpress SARS-CoV-2/FLU/RSV plus assay is intended as an aid in the diagnosis of influenza from Nasopharyngeal swab specimens and should not be used as a sole basis for treatment. Nasal washings and aspirates are unacceptable for Xpert Xpress SARS-CoV-2/FLU/RSV testing.  Fact Sheet for Patients: BloggerCourse.com  Fact Sheet for Healthcare Providers: SeriousBroker.it  This test is not yet approved or cleared by the [place] FDA and has been authorized for detection and/or diagnosis of SARS-CoV-2 by FDA under an Emergency Use Authorization (EUA). This EUA will remain in effect (meaning this test can be used) for the duration of the COVID-19 declaration under Section 564(b)(1) of the Act, 21  U.S.C. section 360bbb-3(b)(1), unless the authorization is terminated or revoked.  Performed at [facility], [address] [place], [place] [address]   MRSA Next Gen by PCR, Nasal     Status: None   Collection Time: 04/24/21  6:34 PM   Specimen: Nasal Mucosa; Nasal Swab  Result Value Ref Range Status   MRSA by PCR Next Gen NOT DETECTED NOT DETECTED Final    Comment: (NOTE) The GeneXpert MRSA Assay (FDA approved for NASAL specimens only), is one component of a comprehensive MRSA colonization surveillance program. It is not intended to diagnose MRSA infection nor to guide or monitor treatment for MRSA infections. Test performance is not FDA approved in patients less than 33 years old. Performed at [facility], [address], [place], [place] [address]     FURTHER DISCHARGE INSTRUCTIONS:  Get Medicines reviewed and adjusted: Please take all your medications with you for your next visit with your Primary MD  Laboratory/radiological data: Please request your Primary MD to go over all hospital tests and procedure/radiological results at the follow up, please ask your Primary MD to get all Hospital records sent to his/her office.  In some cases, they will be blood work, cultures and biopsy results pending at the time of your discharge. Please request that your primary care M.D. goes through all the records of your hospital data and follows up on these results.  Also Note the following: If you experience worsening of your admission symptoms, develop shortness of breath, life threatening emergency, suicidal or homicidal thoughts you must seek medical attention immediately by calling 911 or calling your MD immediately  if symptoms less severe.  You must read complete instructions/literature along with all the possible adverse reactions/side effects for all the Medicines you take and that have been prescribed to you. Take any new Medicines after you have  completely understood and accpet all the possible adverse reactions/side effects.   Do not drive when taking Pain medications or sleeping medications (Benzodaizepines)  Do not take more than prescribed Pain, Sleep and Anxiety Medications. It is not advisable to combine anxiety,sleep and pain medications without talking with your primary care practitioner  Special Instructions: If you have smoked or chewed Tobacco  in the last 2 yrs please stop smoking, stop any regular Alcohol  and or any Recreational drug use.  Wear Seat belts while driving.  Please note: You were cared for by a hospitalist during your hospital stay. Once you are discharged, your primary care physician will handle any further medical issues. Please note that NO REFILLS for any discharge medications will be authorized once you are discharged, as it is imperative that you return to your primary care physician (or establish a relationship with a primary care physician if you do not have one) for your post hospital discharge needs so that they can reassess your need for medications and monitor your lab values.  Total Time spent coordinating discharge including counseling, education and face to face time equals 35 minutes.  SignedOren Binet 04/26/2021 11:59 AM

## 2021-05-02 LAB — GLUCOSE, CAPILLARY: Glucose-Capillary: 120 mg/dL — ABNORMAL HIGH (ref 70–99)

## 2021-05-11 ENCOUNTER — Other Ambulatory Visit (HOSPITAL_COMMUNITY): Payer: Self-pay

## 2021-05-17 ENCOUNTER — Telehealth (HOSPITAL_COMMUNITY): Payer: Self-pay

## 2021-05-17 ENCOUNTER — Other Ambulatory Visit (HOSPITAL_COMMUNITY): Payer: Self-pay

## 2021-05-17 NOTE — Telephone Encounter (Signed)
Pharmacy Transitions of Care Follow-up Telephone Call  Date of discharge: 04/26/21  Discharge Diagnosis: Acute lower GI bleed/clot  How have you been since you were released from the hospital?  Patient well since discharge, no questions about meds at this time.  Medication changes made at discharge:      START taking: Eliquis DVT/PE Starter Pack (Apixaban Starter Pack (10mg  and 5mg ))  STOP taking: aspirin 81 MG EC tablet   Medication changes verified by the patient? Yes    Medication Accessibility:  Home Pharmacy: Not discussed   Was the patient provided with refills on discharged medications? No   Have all prescriptions been transferred from [facility] to home pharmacy? N/A   Is the patient able to afford medications? Has insurance    Medication Review:  APIXABAN (ELIQUIS)  Apixaban 10 mg BID initiated on 04/26/21. Will switch to apixaban 5 mg BID after 7 days (DATE ).  - Discussed importance of taking medication around the same time everyday  - Reviewed potential DDIs with patient  - Advised patient of medications to avoid (NSAIDs, ASA)  - Educated that Tylenol (acetaminophen) will be the preferred analgesic to prevent risk of bleeding  - Emphasized importance of monitoring for signs and symptoms of bleeding (abnormal bruising, prolonged bleeding, nose bleeds, bleeding from gums, discolored urine, black tarry stools)  - Advised patient to alert all providers of anticoagulation therapy prior to starting a new medication or having a procedure   Follow-up Appointments:  PCP Hospital f/u appt confirmed? Patient has already seen PCP and has refills sent to home pharmacy for [facility] f/u appt confirmed? Working on getting cardiologist and hematologist in [place]  If their condition worsens, is the pt aware to call PCP or go to the Emergency Dept.? yes  Final Patient Assessment: Patient getting f/u and has refills at home pharmacy

## 2021-12-05 ENCOUNTER — Other Ambulatory Visit (HOSPITAL_BASED_OUTPATIENT_CLINIC_OR_DEPARTMENT_OTHER): Payer: Self-pay

## 2023-05-26 IMAGING — CT CT CTA ABD/PEL W/CM AND/OR W/O CM
3 of 10 series · 11 of 46 positions shown, 17 images · IV contrast (Omni 300)
Comparison: None

CLINICAL DATA: GI bleed, bright red blood per rectum

EXAM:
CTA ABDOMEN AND PELVIS WITHOUT AND WITH CONTRAST
TECHNIQUE: Multidetector CT imaging of the abdomen and pelvis was performed
using the standard protocol during bolus administration of
intravenous contrast. Multiplanar reconstructed images and MIPs were
obtained and reviewed to evaluate the vascular anatomy.
CONTRAST:  100mL OMNIPAQUE IOHEXOL 350 MG/ML SOLN IV

[Series 5: arterial 3.0 · axial · arterial · 0.81mm/px · z∈[+846,+960]mm · 3 of 152 slices shown]
[im 13/152  soft-tissue]
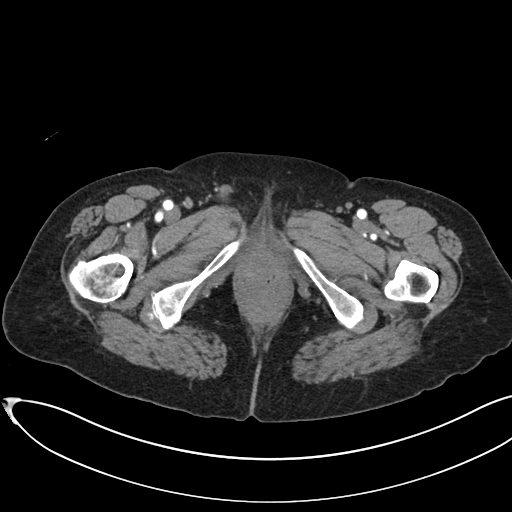
[im 38/152  soft-tissue]
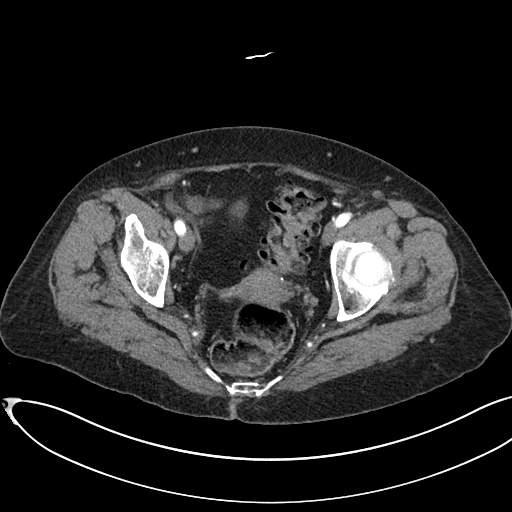
[im 51/152  soft-tissue]
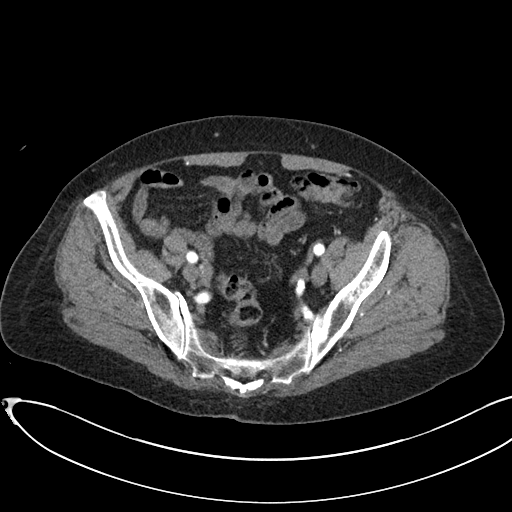

[Series 6: portal venous · axial · portal-venous · 0.81mm/px · z∈[+873,+1198]mm · 6 of 92 slices shown, 11 images]
[im 14/92  soft-tissue]
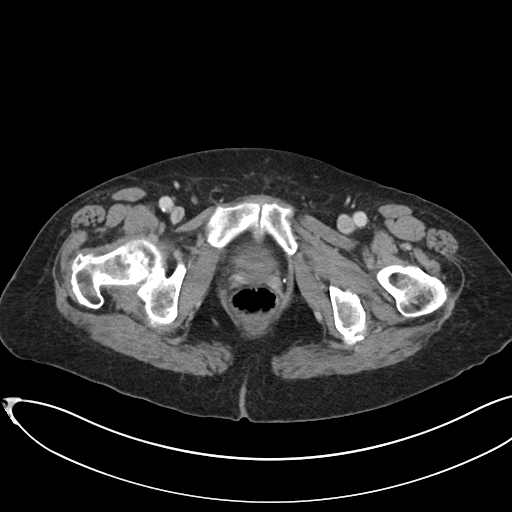
[im 14/92  bone]
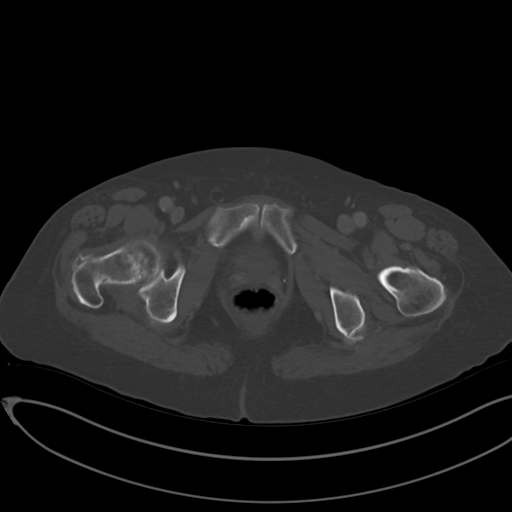
[im 27/92  soft-tissue]
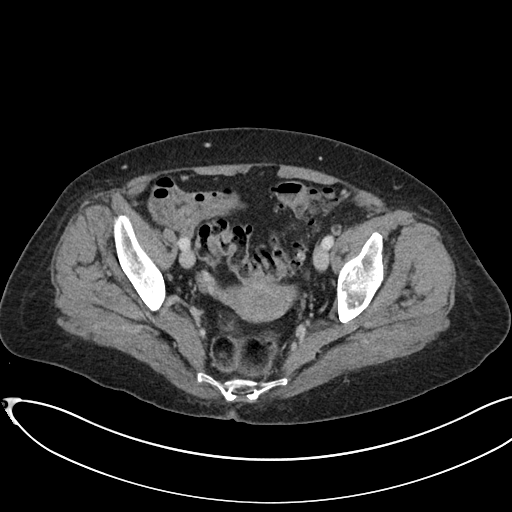
[im 40/92  soft-tissue]
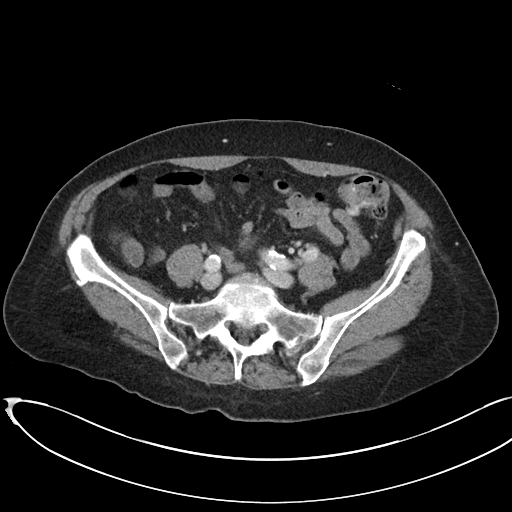
[im 40/92  lung]
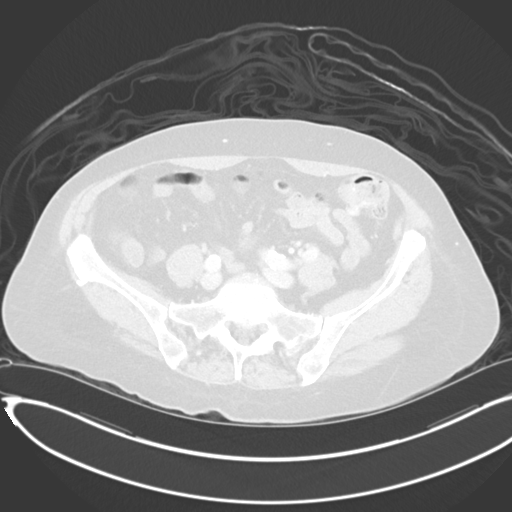
[im 53/92  soft-tissue]
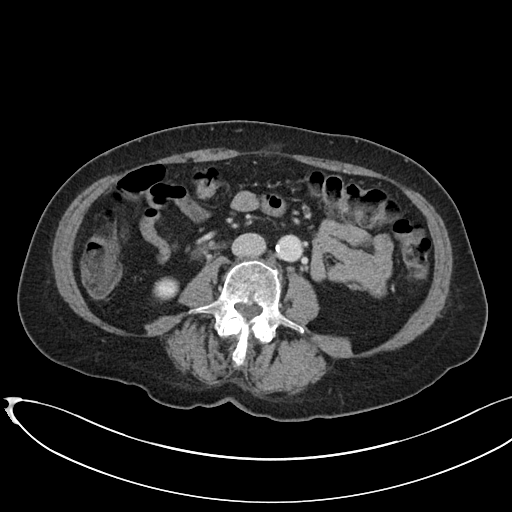
[im 53/92  lung]
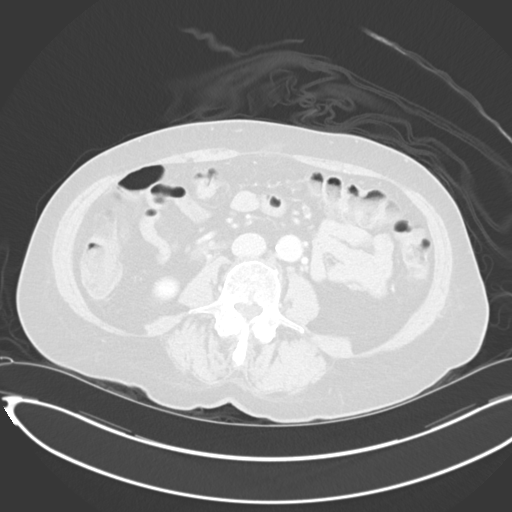
[im 66/92  soft-tissue]
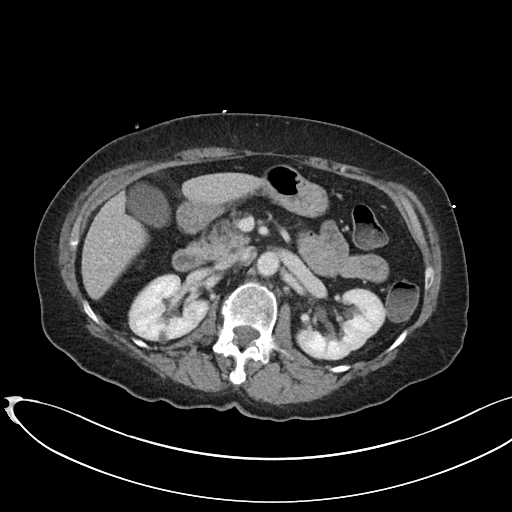
[im 66/92  lung]
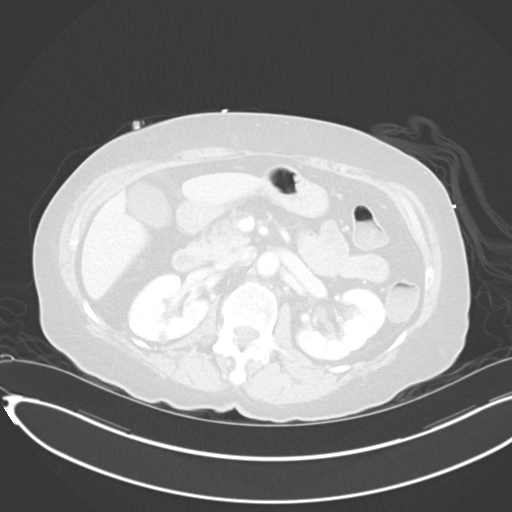
[im 79/92  soft-tissue]
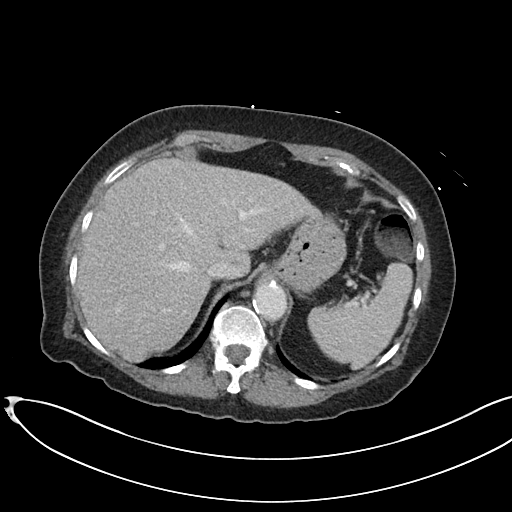
[im 79/92  lung]
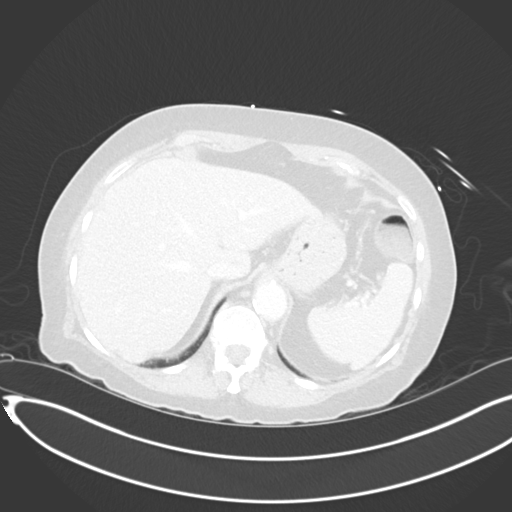

[Series 10: arterial cor · coronal · arterial · 0.86mm/px · 2 of 133 slices shown, 3 images]
[im 45/133  soft-tissue]
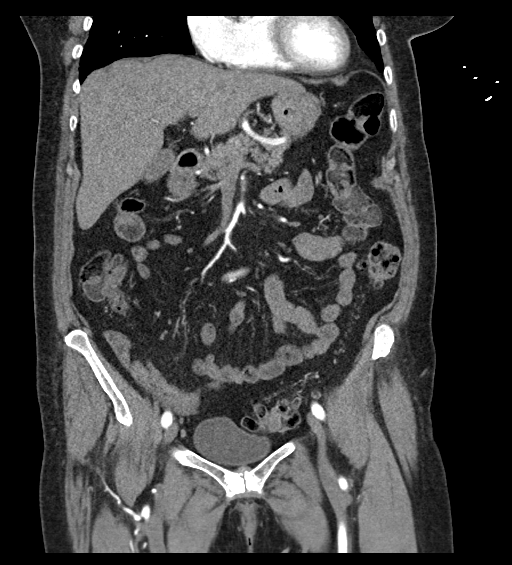
[im 45/133  bone]
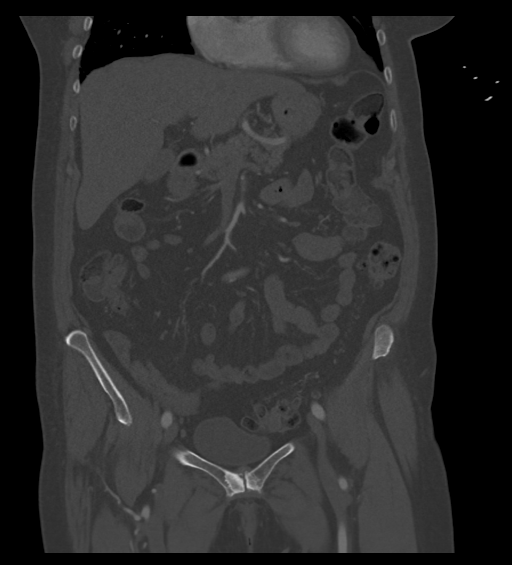
[im 89/133  soft-tissue]
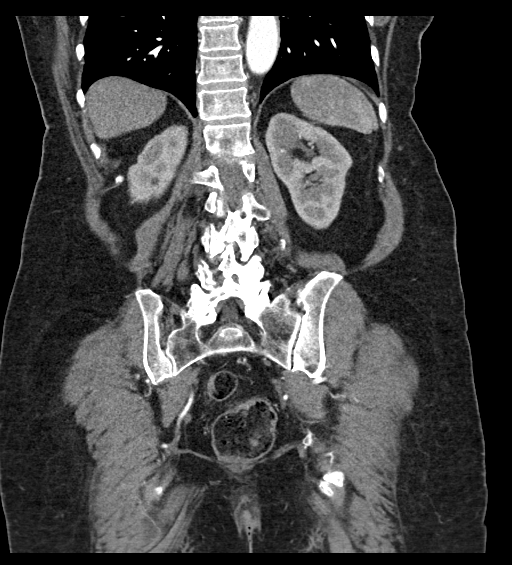

[11 of 46 positions shown; findings below may reference images not displayed]

FINDINGS: VASCULAR

Aorta: Atherosclerotic calcifications of tortuous abdominal aorta
without aneurysm. No intramural hematoma on precontrast imaging. No
dissection. No significant thrombus.

Celiac: Mild plaque at origin.  No significant stenosis.

SMA: Widely patent

Renals: Plaque formation at BILATERAL renal artery origins, not
significant on LEFT, estimated 50% on RIGHT.

IMA: Patent origin

Inflow: Atherosclerotic calcifications and tortuosity of common
iliac arteries. Additional calcified plaque within internal iliac
arteries bilaterally.

Proximal Outflow: Mild plaque at common femoral arteries and
bifurcations

Veins: Patent

Review of the MIP images confirms the above findings.

NON-VASCULAR

Lower chest: Lung bases clear

Hepatobiliary: Cyst at central liver 2.2 x 1.7 cm series 6, image
16. Gallbladder and liver otherwise unremarkable.

Pancreas: Normal appearance

Spleen: Normal appearance

Adrenals/Urinary Tract: Tiny BILATERAL renal cysts. Adrenal glands,
kidneys, ureters, and bladder otherwise normal appearance.

Stomach/Bowel: Diffuse diverticulosis of descending and sigmoid
colon. Active site of IV contrast extravasation identified at the
distal descending colon near the descending sigmoid junction, where
a single diverticulum which is low attenuation on precontrast
imaging demonstrates high attenuation postcontrast, and delayed
images showing passage of extravasated blood into the colonic lumen.
Findings consistent with a site of active GI bleeding. No additional
sites of abnormal contrast extravasation identified. Stomach and
remaining bowel loops unremarkable.

Lymphatic: No adenopathy

Reproductive: Unremarkable uterus and ovaries

Other: No free air or free fluid.  No hernia.

Musculoskeletal: Diffuse osseous demineralization.
IMPRESSION: VASCULAR

Scattered atherosclerotic plaque formation without evidence of
critical stenosis.

Approximately 50% stenosis at celiac artery origin.

No evidence of aneurysm or dissection.

NON-VASCULAR

Active site of IV contrast extravasation at a diverticulum at the
distal descending colon near the descending sigmoid junction with
contrast extending in to the colonic lumen on delayed images
consistent with active GI bleeding.

Aortic Atherosclerosis ([id number]).

Findings discussed with Dr. Gwendeva on 04/23/2021 at 9127 hours.

## 2023-05-26 IMAGING — XA IR EMBO ARTERIAL NOT [PERSON_NAME] INC GUIDE ROADMAPPING
11 of 15 series · 11 of 24 positions shown · IV contrast (IODINE)
Comparison: CTA abdomen and pelvis - earlier same day

INDICATION: Acute lower GI bleeding. Positive CTA for active diverticular
bleeding within the distal descending/proximal sigmoid colon. Please
perform mesenteric arteriogram and percutaneous embolization as
indicated.

EXAM:
1. ULTRASOUND GUIDANCE FOR ARTERIAL ACCESS
2. SELECTIVE SUPERIOR MESENTERIC ARTERIOGRAM
3. FLUSH AORTOGRAM
4. SELECTIVE INFERIOR MESENTERIC ARTERIOGRAM
5.

[Series 2: fl (-) angio · 1 of 26 frames shown]
[frame 23/26]
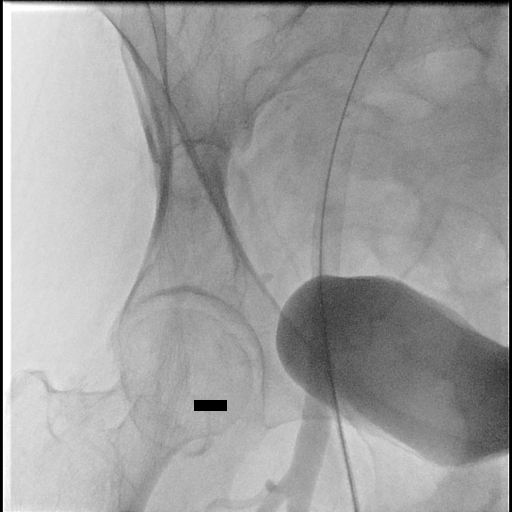

[Series 3: body 4 care · 2 acquisitions, 1 frame shown (1 of 10)]
[im 1/2]
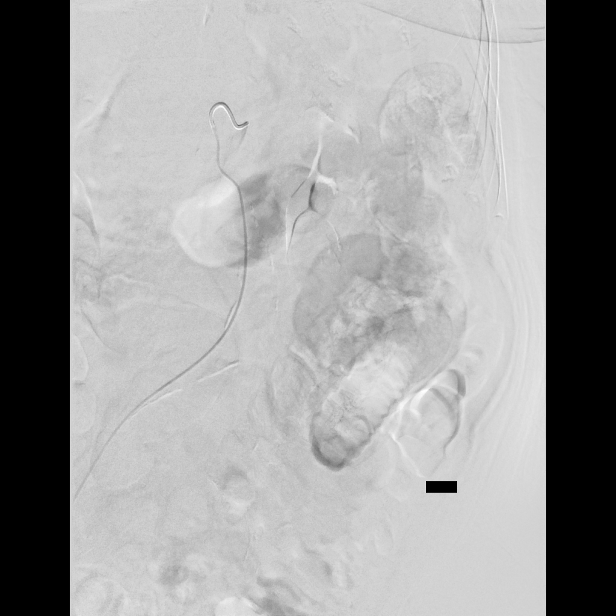

[Series 5: body 4 care · 2 acquisitions, 1 frame shown (2 of 10)]
[im 1/2]
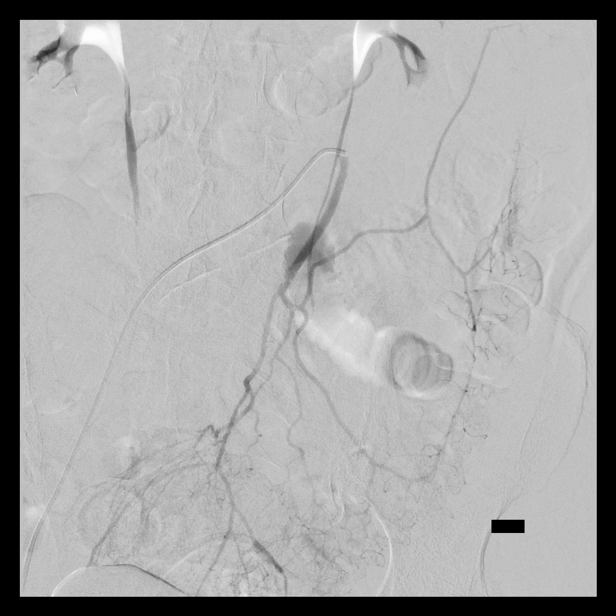

[Series 6: body 4 care · 3 acquisitions, 1 frame shown (3 of 10)]
[im 1/3]
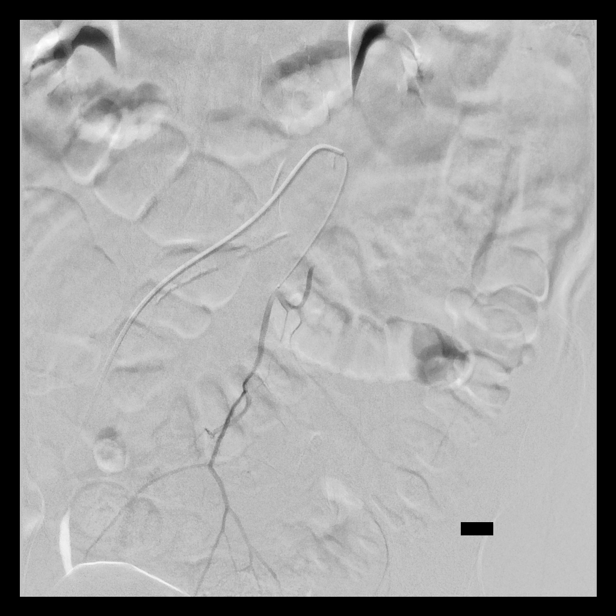

[Series 7: body 4 care · 2 acquisitions, 1 frame shown (4 of 10)]
[im 1/2]
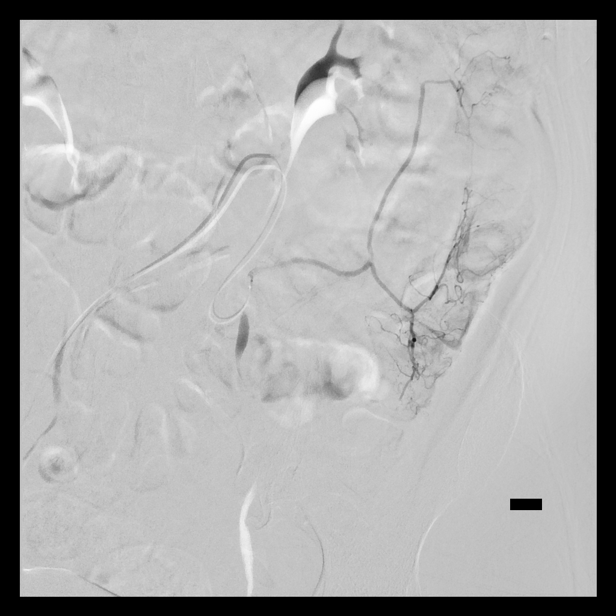

[Series 9: body 4 care · 2 acquisitions, 1 frame shown (5 of 10)]
[im 1/2]
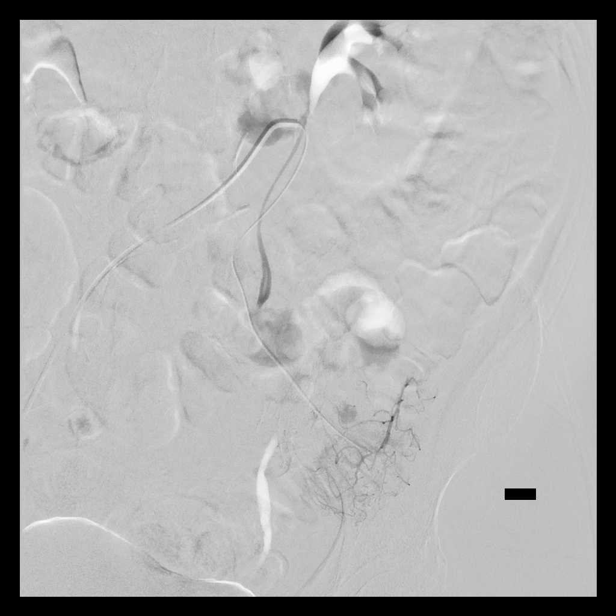

[Series 10: body 4 care · 2 acquisitions, 1 frame shown (6 of 10)]
[im 1/2]
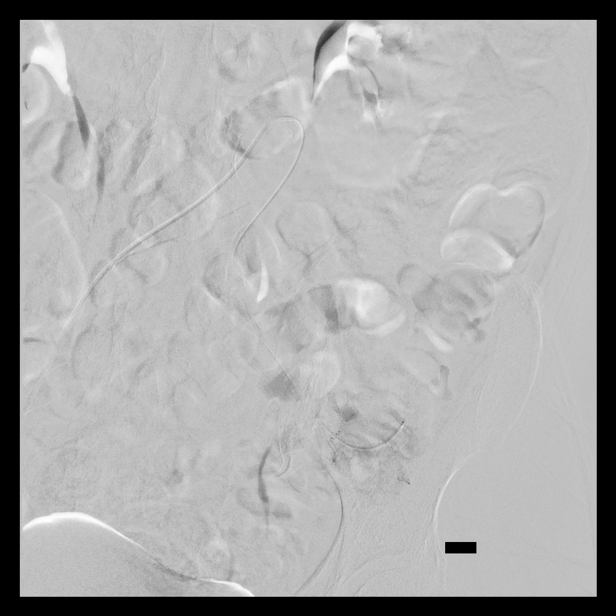

[Series 11: body 4 care · 1 of 2 slices shown (7 of 10)]
[im 2/2]
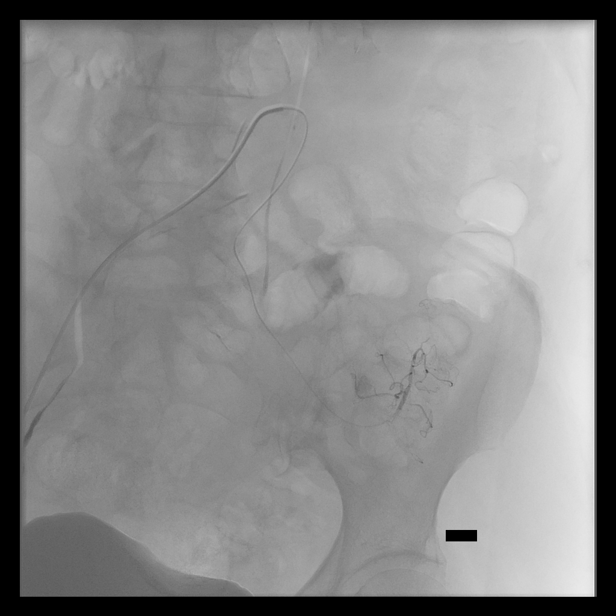

[Series 12: body 4 care · 1 of 2 slices shown (8 of 10)]
[im 2/2]
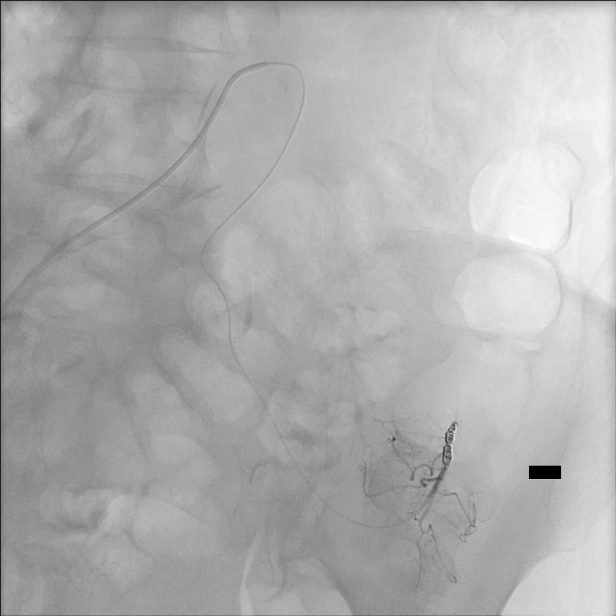

[Series 14: body 4 care · 2 acquisitions, 1 frame shown (9 of 10)]
[im 1/2]
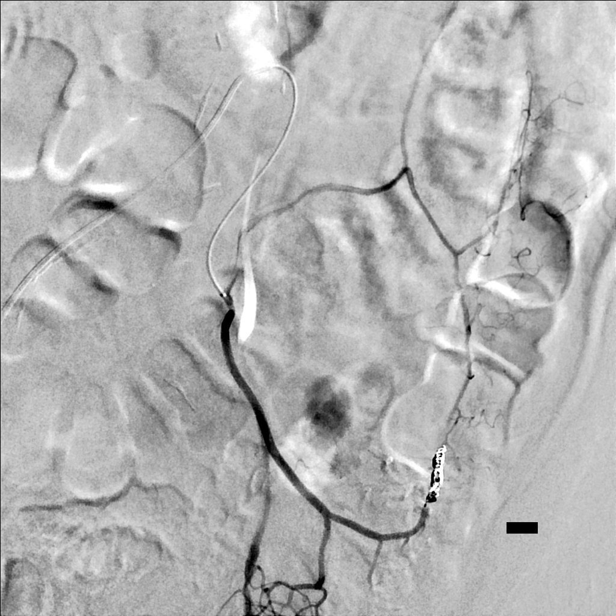

[Series 15: body 4 care · 2 acquisitions, 1 frame shown (10 of 10)]
[im 1/2]
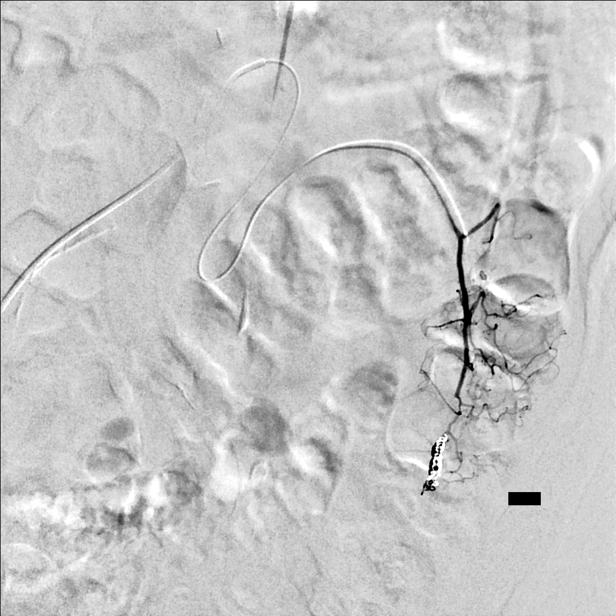

[11 of 24 positions shown; findings below may reference images not displayed]

MEDICATIONS:
None

ANESTHESIA/SEDATION:
Moderate (conscious) sedation was employed during this procedure as
administered by the [REDACTED].

A total of Versed 4 mg and Fentanyl 100 mcg was administered
intravenously.

Moderate Sedation Time: 60 minutes. The patient's level of
consciousness and vital signs were monitored continuously by
radiology nursing throughout the procedure under my direct
supervision.

CONTRAST:  60 cc Omnipaque 300

FLUOROSCOPY TIME:  19.6 minutes (649 mGy)

COMPLICATIONS:
None immediate.

PROCEDURE:
Informed consent was obtained from the patient following explanation
of the procedure, risks, benefits and alternatives. All questions
were addressed. A time out was performed prior to the initiation of
the procedure. Maximal barrier sterile technique utilized including
caps, mask, sterile gowns, sterile gloves, large sterile drape, hand
hygiene, and Betadine prep.

The right femoral head was marked fluoroscopically. Under sterile
conditions and local anesthesia, the right common femoral artery
access was performed with a micropuncture needle. Under direct
ultrasound guidance, the right common femoral was accessed with a
micropuncture kit. An ultrasound image was saved for documentation
purposes. This allowed for placement of a 5-French vascular sheath.
A limited arteriogram was performed through the side arm of the
sheath confirming appropriate access within the right common femoral
artery.

Over a Bentson wire, a Mickelson catheter was advanced the caudal
aspect of the thoracic aorta where was reformed, back bled and
flushed.

The Mickelson catheter was then utilized to select the superior
mesenteric artery and a selective superior mesenteric arteriogram
was performed.

Next, the Mickelson catheter was utilized to attempt to cannulate
the inferior mesenteric artery however this proved challenging given
tortuosity of the abdominal aorta. As such, over Bentson wire, the
Mickelson catheter was exchanged for an Omni Flush catheter and a
flush abdominal aortogram was performed demarcating the origin of
the IMA.

Next, with the use of a 5 French angled glide catheter, the IMA was
selected and a selective inferior mesenteric arteriogram was
performed.

With the use of a fathom 14 microwire, a regular Renegade
microcatheter was advanced to the level of the trifurcation of the
IMA and a sub selective inferior mesenteric arteriogram was
performed.

The microcatheter was then advanced to select the sigmoidal artery
and a selective sigmoidal arteriogram was performed.

The microcatheter was advanced into the distal arcade of the
sigmoidal artery, beyond the location of the distal tributaries
supplying the ill-defined area of contrast extravasation within the
distal descending/proximal sigmoid colon. Contrast injection
confirmed appropriate positioning and the short-segment of the
distal arcade was percutaneously coil embolized with overlapping 2
mm diameter interlock coils.

The microcatheter was retracted to the level of the sigmoidal artery
and a post embolization sigmoidal arteriogram was performed. The
microcatheter was then utilized to select the left colic artery and
a selective left colic arteriogram was performed

Images were reviewed and the procedure was terminated. All wires,
catheters and sheaths were removed from the patient. Hemostasis was
achieved at the right groin access site with manual compression.

The patient tolerated the procedure well without immediate post
procedural complication.
FINDINGS: Selective inferior mesenteric arteriogram demonstrates a
conventional branching pattern and is negative for significant
arterial supply to the distal descending/proximal sigmoid colon. No
discrete areas of vessel irregularity or contrast extravasation are
identified.

Flush abdominal aortogram demonstrates patency of the IMA. The
abdominal aorta is noted to be tortuous but of normal caliber as are
the bilateral common and internal iliac arteries.

Selective inferior mesenteric arteriogram demonstrates a
conventional branching pattern. A ill-defined area of contrast
extravasation is seen at the level of the distal descending/proximal
sigmoid colon, correlating with the findings on preceding CTA.
Dominant arterial supply to this area of contrast extravasation is
via the sigmoidal artery.

Sub selective sigmoidal arteriogram confirms an ill-defined area of
contrast extravasation supplied via a tiny tertiary branch from the
distal arcade of the sigmoidal artery.

The distal arcade was percutaneously coil embolized across the
origin of the tiny tertiary branch supplying the ill-defined area of
contrast extravasation.

Post embolization selective arteriograms performed both from the
level of the sigmoidal and left colic arteries are negative for
persistent active extravasation.
IMPRESSION: Technically successful percutaneous coil embolization of a distal
arcade of the sigmoidal artery across the origin of a tiny tertiary
branch supplying an ill-defined area of contrast extravasation at
the level of the distal descending/proximal sigmoid colon,
correlating with the findings seen on preceding CTA.

PLAN:
- The patient is to remain flat for 4 hours with right leg straight.

- The patient will continue to experience several additional bloody
bowel movements and may continue to require additional resuscitation
(as she was bleeding both before and during the procedure), however
ultimately I am hopeful she will stabilize in the coming days.

- While presumably secondary to diverticular disease, repeat
colonoscopy after the resolution of acute symptoms is advised to
exclude the presence of an underlying mass/lesion.

## 2023-05-27 IMAGING — DX DG RIBS 2V*L*
2 series · 6 of 6 positions shown · non-contrast
Comparison: None.

CLINICAL DATA: CPR, rib pain.

EXAM:
LEFT RIBS - 2 VIEW

[Series 1: rib · 0.14mm/px · 5 of 5 slices shown]
[im 1/5]
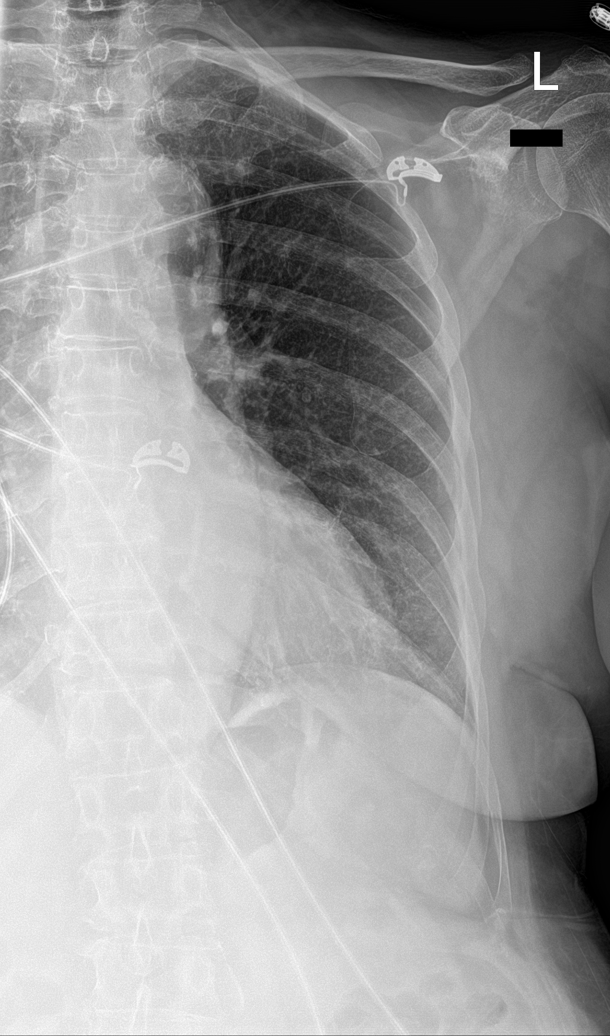
[im 2/5]
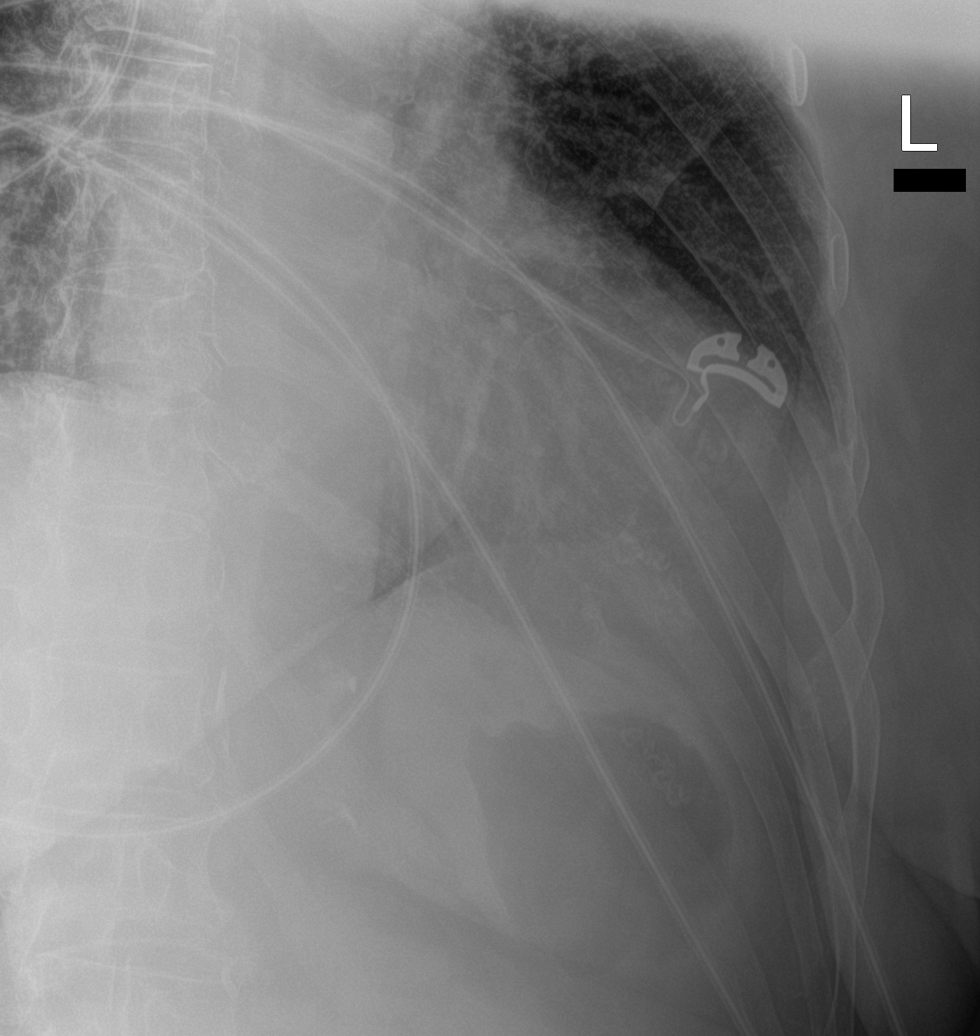
[im 3/5]
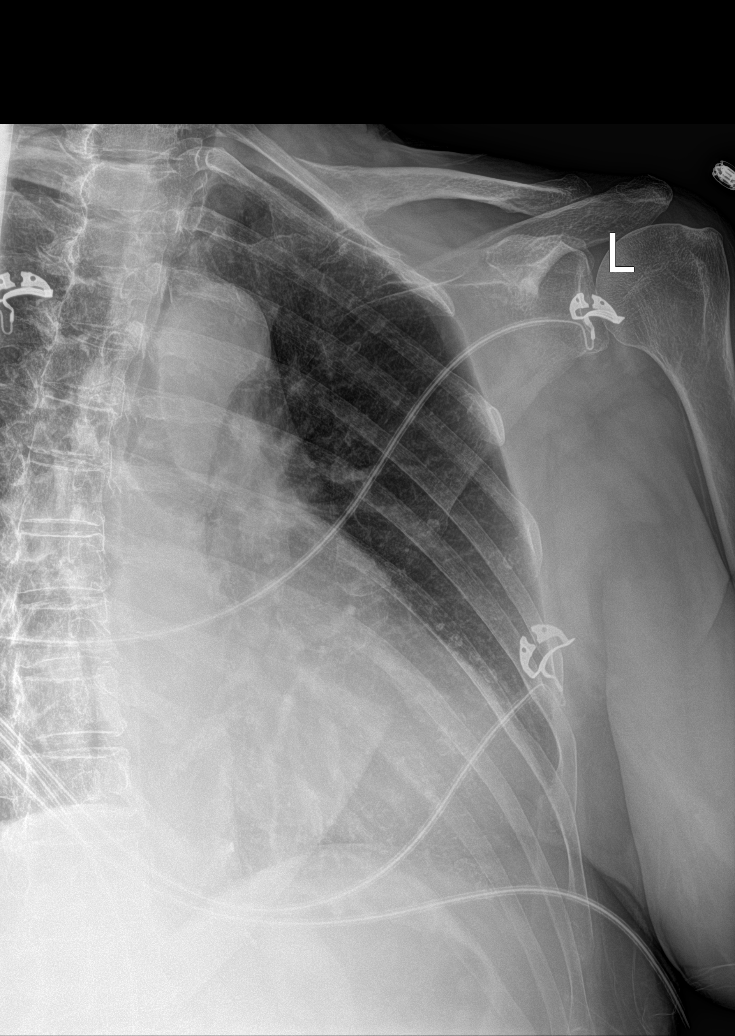
[im 4/5]
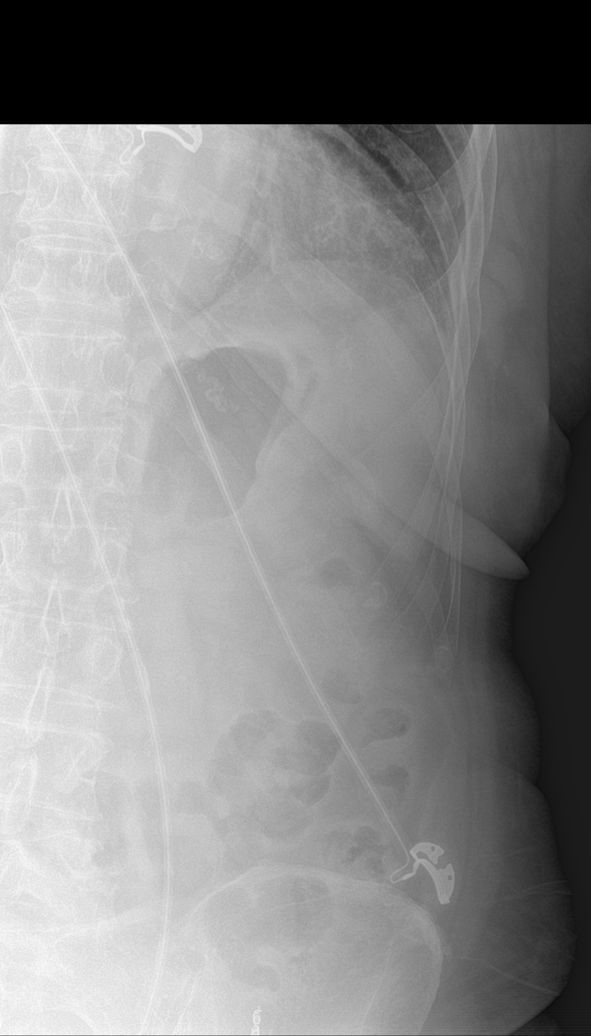
[im 5/5]
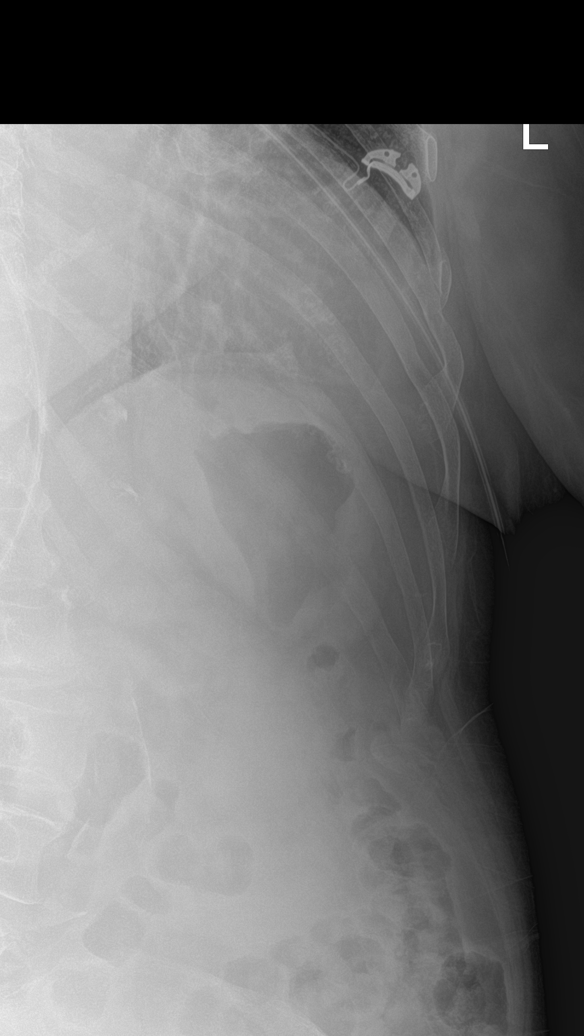

[chest]
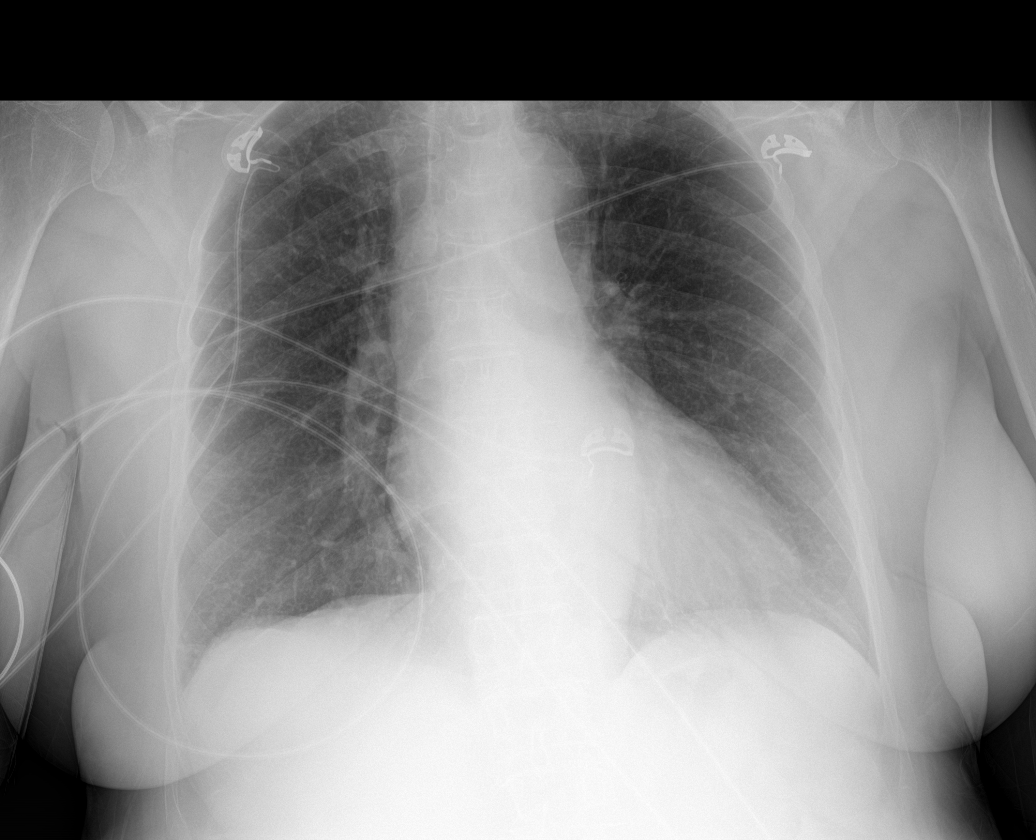

[6 of 6 positions shown; findings below may reference images not displayed]

FINDINGS: The lungs are clear. There is no pleural effusion or pneumothorax.
The cardiomediastinal silhouette is within normal limits.

There are acute nondisplaced left anterior fifth, sixth and seventh
rib fractures. No displaced rib fractures are identified.
IMPRESSION: 1. Acute nondisplaced left fifth, sixth and seventh rib fractures.
2. No other acute cardiopulmonary process.

## 2023-05-27 IMAGING — DX DG RIBS 2V*R*
1 series · 4 of 4 positions shown · non-contrast
Comparison: None.

CLINICAL DATA: Pt complains of bilateral rib pain. Pt states she
received a single compression from CPR and that is when the pain
started. No other injury noted.

EXAM:
RIGHT RIBS - 2 VIEW

[Series 1: rib · 0.14mm/px · 4 of 4 slices shown]
[im 1/4]
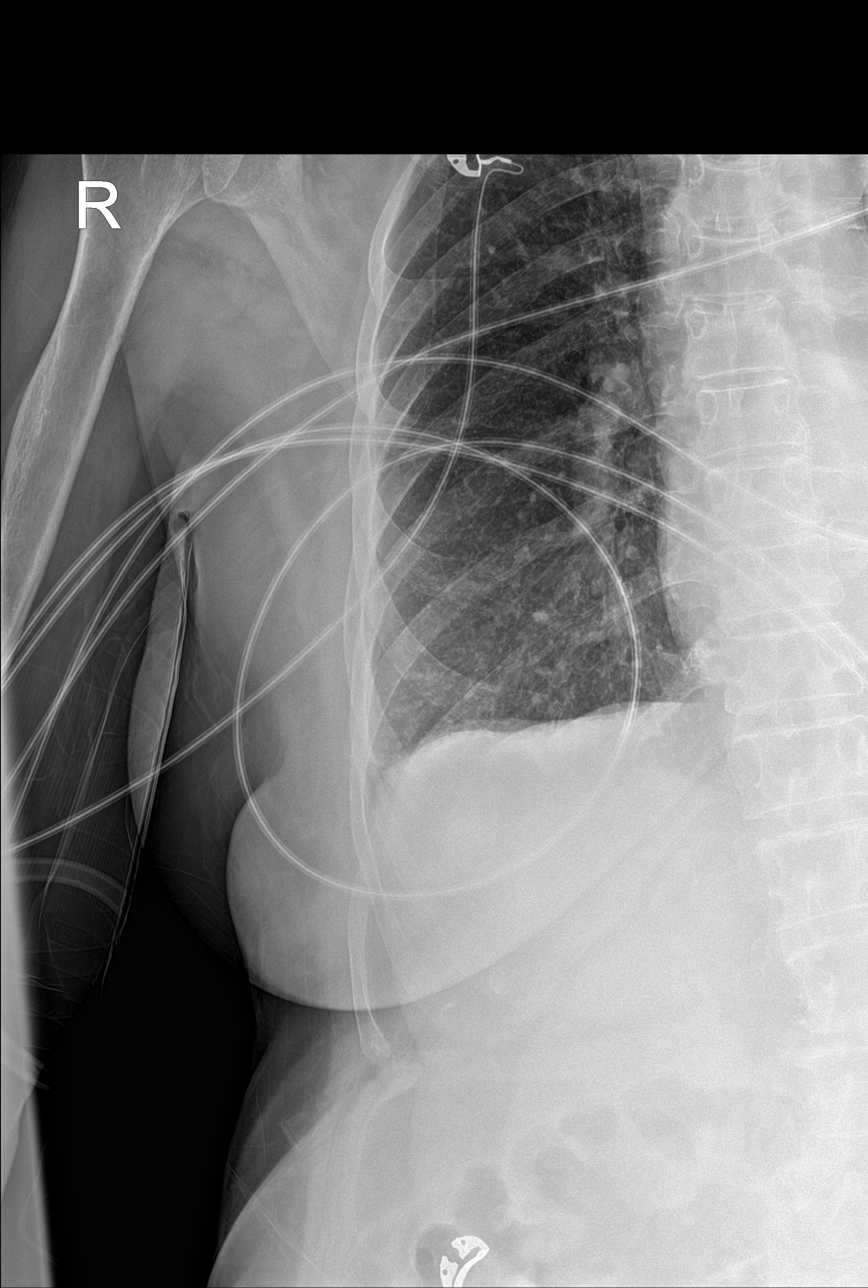
[im 2/4]
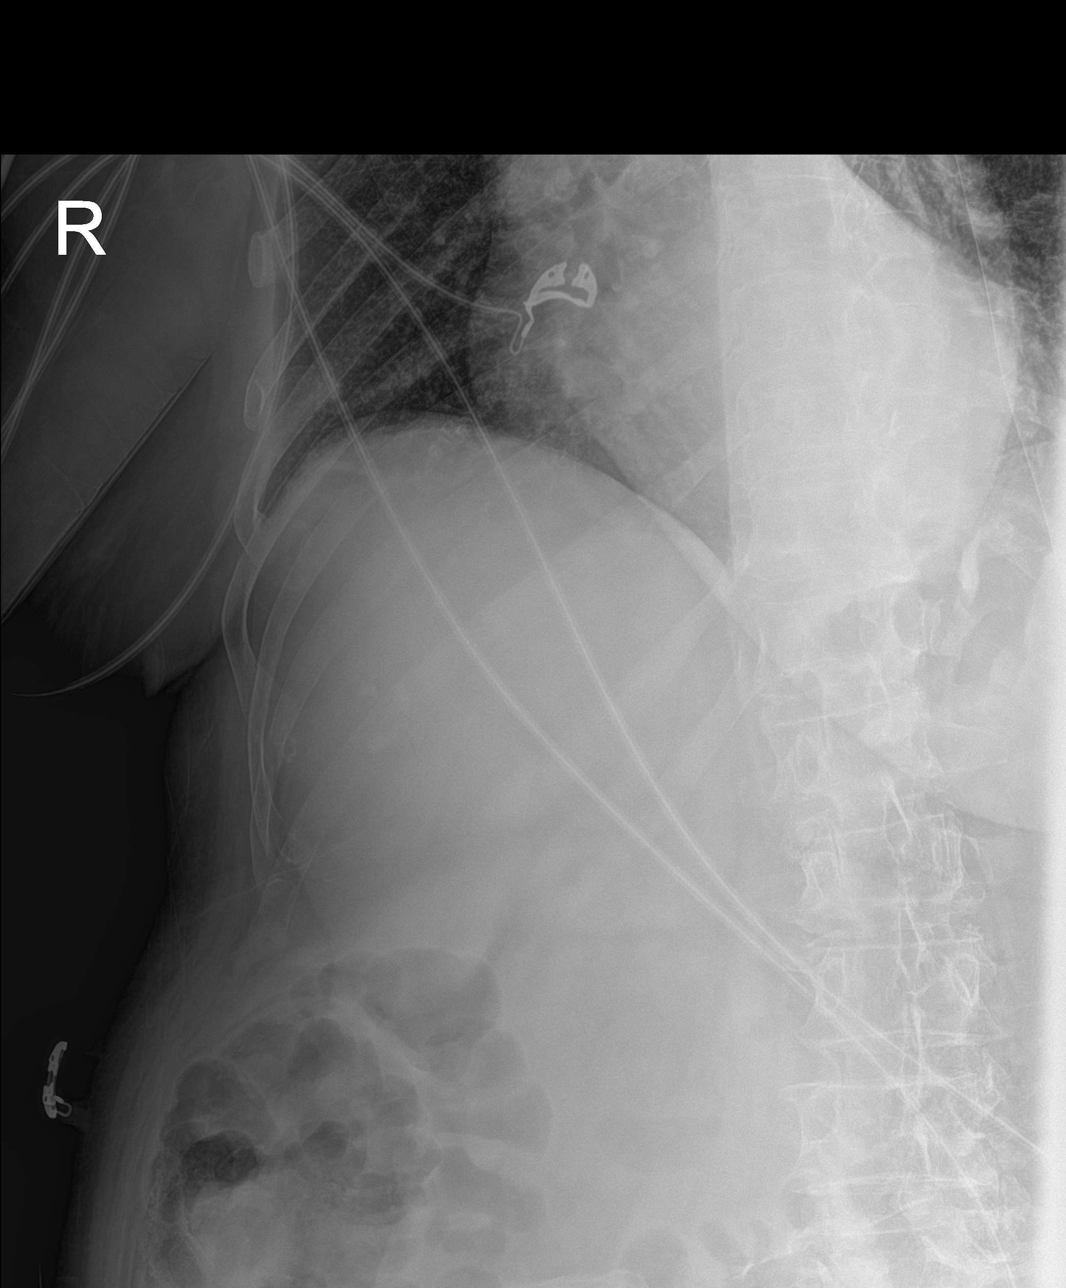
[im 3/4]
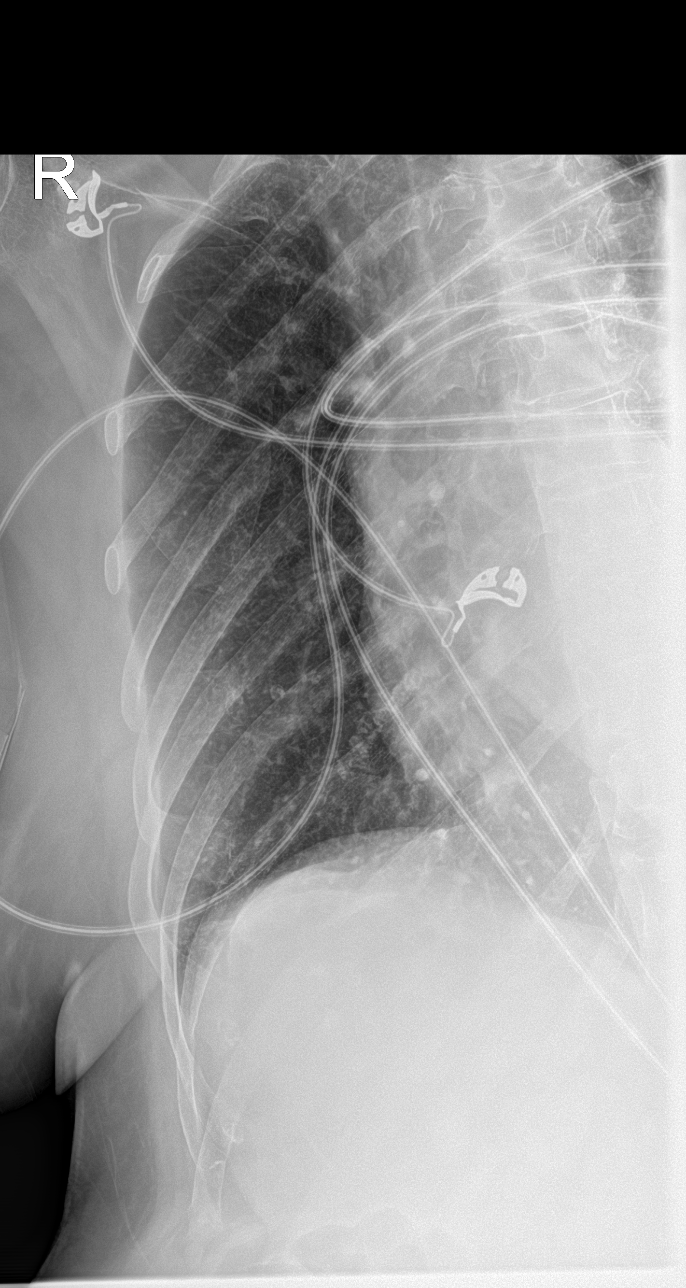
[im 4/4]
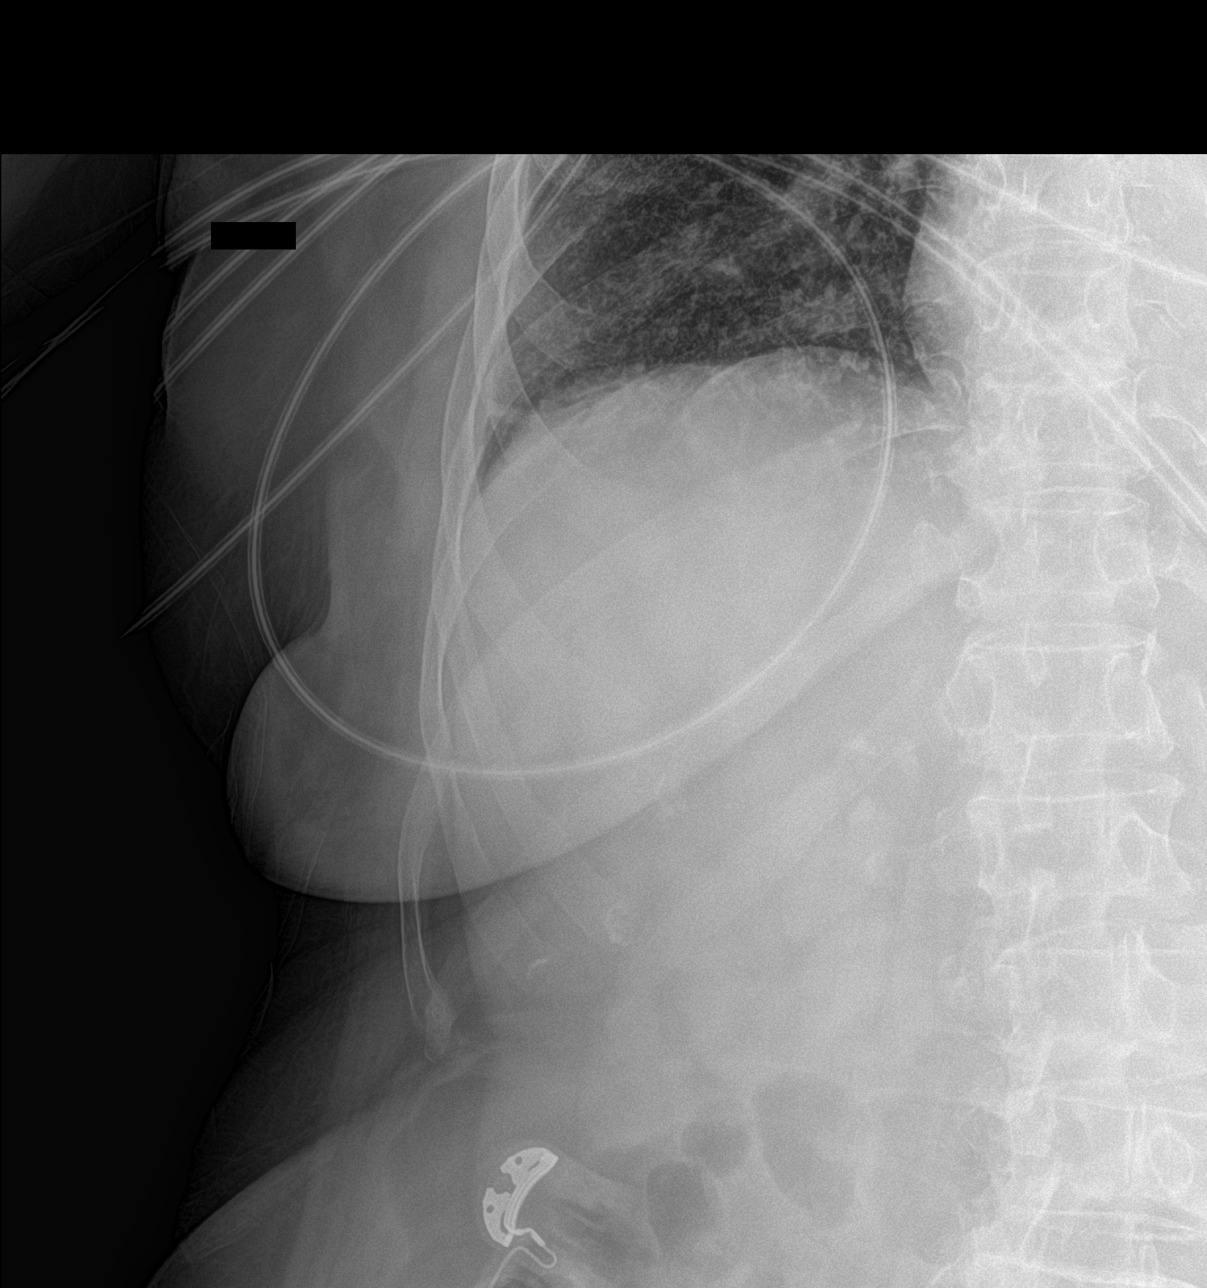

[4 of 4 positions shown; findings below may reference images not displayed]

FINDINGS: No acute displaced fracture or other bone lesions are seen involving
the right ribs.
IMPRESSION: No acute displaced rib fracture. Please note, nondisplaced rib
fractures may be occult on radiograph.

## 2023-05-28 IMAGING — CT CT ANGIO CHEST
2 of 6 series · 18 of 36 positions shown · IV contrast (omnipaque)
Comparison: Plain films of the ribs of 1 day prior.

CLINICAL DATA: PE suspected.  Low to intermediate probability.

EXAM:
CT ANGIOGRAPHY CHEST WITH CONTRAST
TECHNIQUE: Multidetector CT imaging of the chest was performed using the
standard protocol during bolus administration of intravenous
contrast. Multiplanar CT image reconstructions and MIPs were
obtained to evaluate the vascular anatomy.
CONTRAST:  100mL OMNIPAQUE IOHEXOL 350 MG/ML SOLN

[Series 8: pe thins · axial · 0.87mm/px · z∈[-705,-445]mm · 17 of 413 slices shown]
[im 21/413  lung]
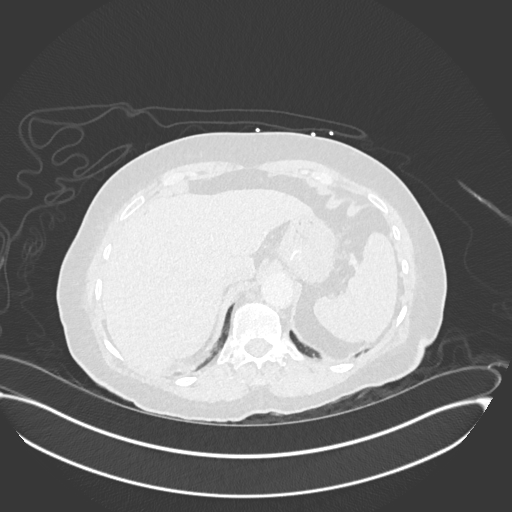
[im 42/413  mediastinal]
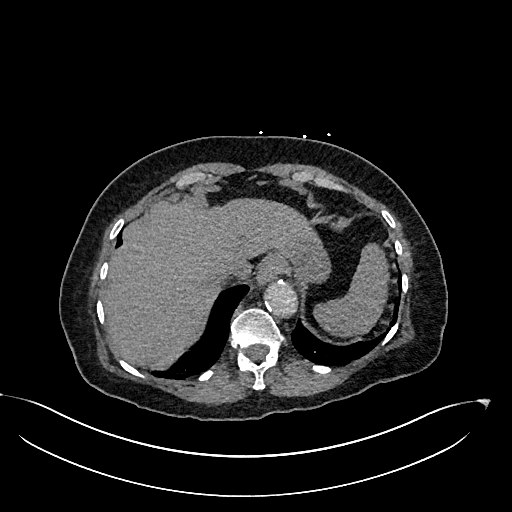
[im 62/413  lung]
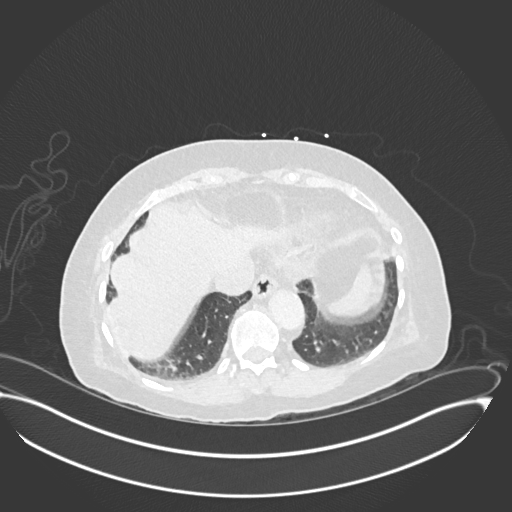
[im 83/413  mediastinal]
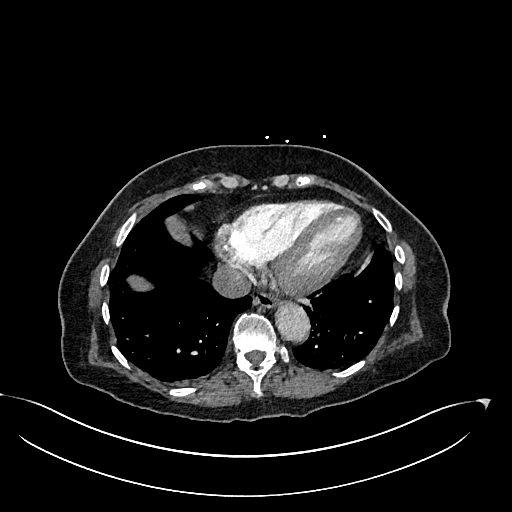
[im 124/413  lung]
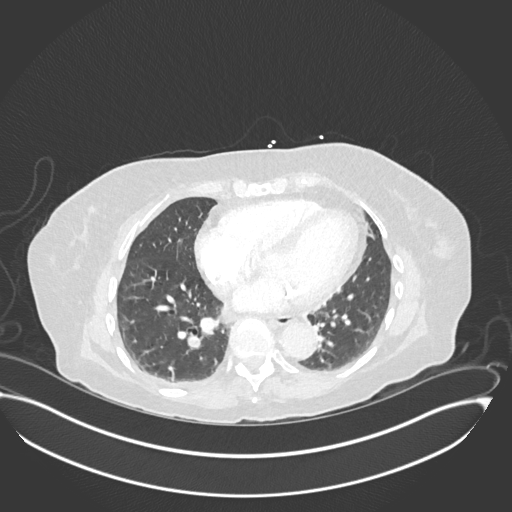
[im 145/413  mediastinal]
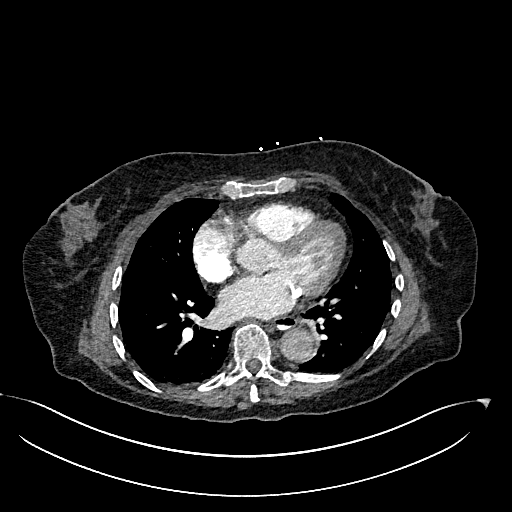
[im 165/413  lung]
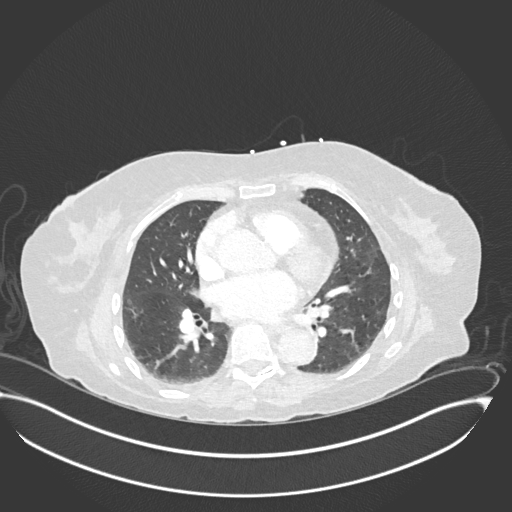
[im 186/413  mediastinal]
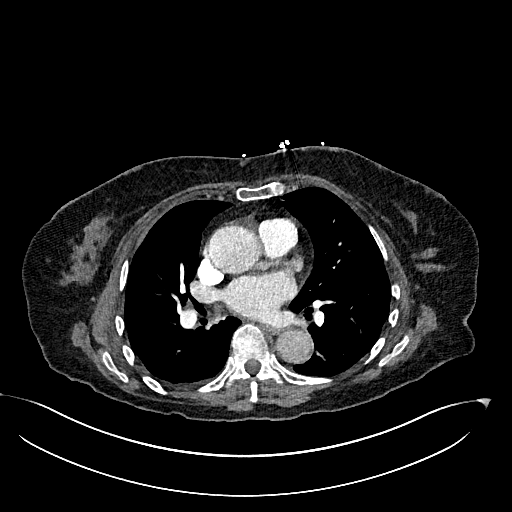
[im 207/413  lung]
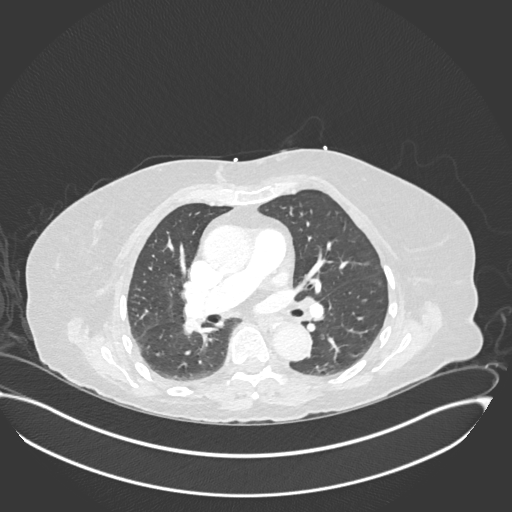
[im 227/413  mediastinal]
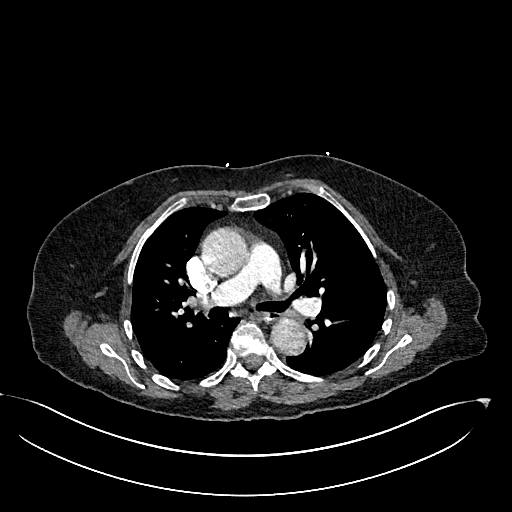
[im 248/413  lung]
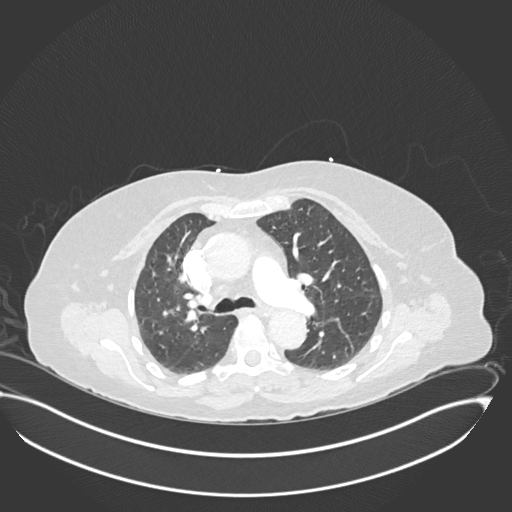
[im 268/413  mediastinal]
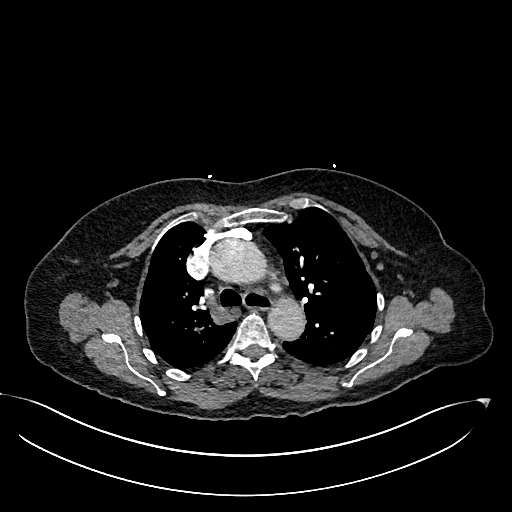
[im 289/413  lung]
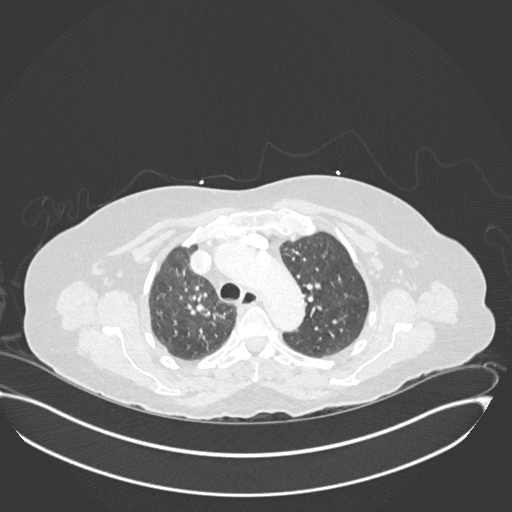
[im 330/413  mediastinal]
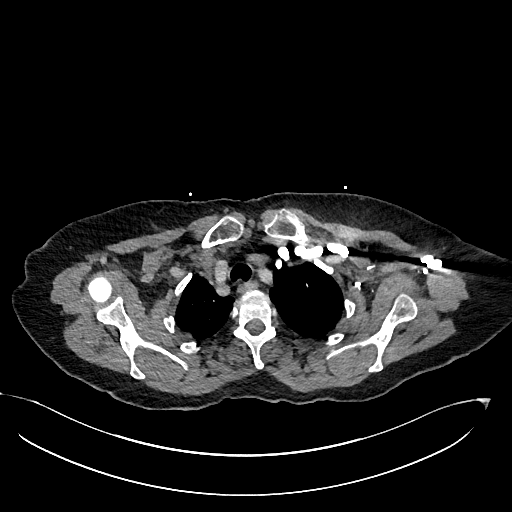
[im 351/413  lung]
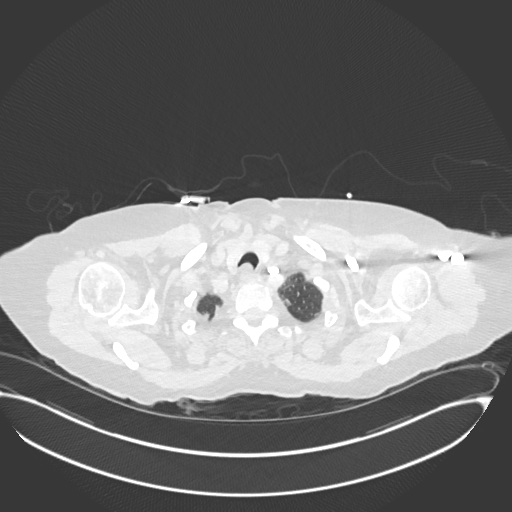
[im 371/413  mediastinal]
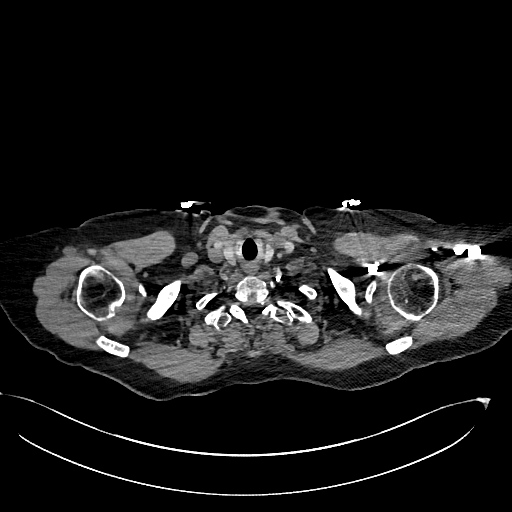
[im 392/413  lung]
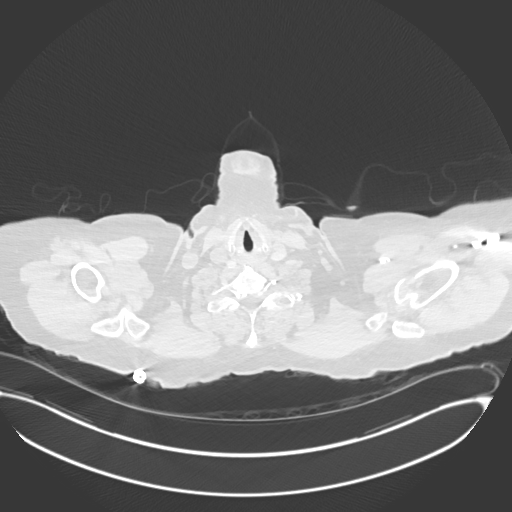

[Series 9: pe 2mm cor · coronal · 0.56mm/px · 1 of 129 slices shown]
[im 65/129  mediastinal]
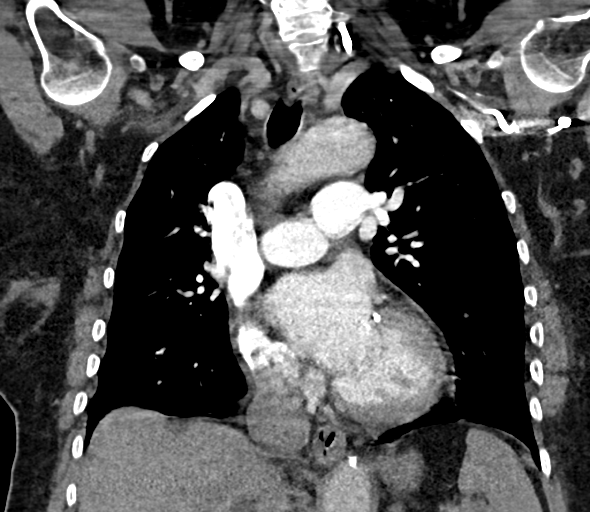

[18 of 36 positions shown; findings below may reference images not displayed]

FINDINGS: Cardiovascular: The quality of this exam for evaluation of pulmonary
embolism is sufficient. The bolus is suboptimally timed and there is
minimal motion degradation. Right lower lobe segmental, nearly
occlusive thrombus including on 309/8 and 92/9.

Aortic atherosclerosis. Tortuous thoracic aorta. Normal heart size,
without pericardial effusion. Lad coronary artery calcification.

Mediastinum/Nodes: No mediastinal or hilar adenopathy. Contrast air
level in the esophagus on 93/6.

Lungs/Pleura: Trace right pleural fluid. 3 mm nodule on the right
minor fissure on 78/7. 3-4 mm right middle lobe pulmonary nodule on
99/7.

Upper Abdomen: Hepatic right hepatic lobe cysts. Normal imaged
portions of the spleen, stomach, left adrenal gland.

Musculoskeletal: No acute osseous abnormality.

Review of the MIP images confirms the above findings.
IMPRESSION: 1. Isolated right lower lobe segmental pulmonary embolism.
2. Trace right pleural fluid.
3. Coronary artery atherosclerosis. Aortic Atherosclerosis
([id number]).
4. Esophageal air fluid level suggests dysmotility or
gastroesophageal reflux.
5. Right-sided pulmonary nodules of up to 4 mm. No follow-up needed
if patient is low-risk. Non-contrast chest CT can be considered in
12 months if patient is high-risk. This recommendation follows the
consensus statement: Guidelines for Management of Incidental
Pulmonary Nodules Detected on CT Images: From the [HOSPITAL]

1. These results will be called to the ordering clinician or
representative by the Radiologist Assistant, and communication
documented in the PACS or [REDACTED].
